# Patient Record
Sex: Female | Born: 1990 | Hispanic: Yes | Smoking: Never smoker
Health system: Southern US, Community
[De-identification: ages and names within clinical notes are randomized; demographics above are authoritative.]

## PROBLEM LIST (undated history)

## (undated) ENCOUNTER — Inpatient Hospital Stay (HOSPITAL_COMMUNITY): Payer: Self-pay

## (undated) DIAGNOSIS — O24419 Gestational diabetes mellitus in pregnancy, unspecified control: Secondary | ICD-10-CM

## (undated) DIAGNOSIS — E119 Type 2 diabetes mellitus without complications: Secondary | ICD-10-CM

## (undated) HISTORY — PX: NO PAST SURGERIES: SHX2092

---

## 2014-07-24 ENCOUNTER — Emergency Department (HOSPITAL_COMMUNITY)
Admission: EM | Admit: 2014-07-24 | Discharge: 2014-07-24 | Disposition: A | Discharge status: Home / Self Care | Payer: No Typology Code available for payment source | Source: Home / Self Care | Attending: Emergency Medicine | Admitting: Emergency Medicine

## 2014-07-24 ENCOUNTER — Other Ambulatory Visit (HOSPITAL_COMMUNITY)
Admission: RE | Admit: 2014-07-24 | Discharge: 2014-07-24 | Disposition: A | Discharge status: Home / Self Care | Payer: No Typology Code available for payment source | Source: Ambulatory Visit | Attending: Emergency Medicine | Admitting: Emergency Medicine

## 2014-07-24 ENCOUNTER — Encounter (HOSPITAL_COMMUNITY): Payer: Self-pay | Admitting: Emergency Medicine

## 2014-07-24 DIAGNOSIS — Z113 Encounter for screening for infections with a predominantly sexual mode of transmission: Secondary | ICD-10-CM | POA: Insufficient documentation

## 2014-07-24 DIAGNOSIS — N898 Other specified noninflammatory disorders of vagina: Secondary | ICD-10-CM

## 2014-07-24 DIAGNOSIS — N760 Acute vaginitis: Secondary | ICD-10-CM | POA: Insufficient documentation

## 2014-07-24 LAB — POCT URINALYSIS DIP (DEVICE)
BILIRUBIN URINE: NEGATIVE
Glucose, UA: NEGATIVE mg/dL
Hgb urine dipstick: NEGATIVE
Ketones, ur: NEGATIVE mg/dL
NITRITE: NEGATIVE
Protein, ur: NEGATIVE mg/dL
SPECIFIC GRAVITY, URINE: 1.01 (ref 1.005–1.030)
UROBILINOGEN UA: 0.2 mg/dL (ref 0.0–1.0)
pH: 7 (ref 5.0–8.0)

## 2014-07-24 LAB — POCT PREGNANCY, URINE: Preg Test, Ur: NEGATIVE

## 2014-07-24 NOTE — ED Notes (Signed)
Pt  Has  A  Vaginal  Discharge   X  1  Year              Pt  Reports         itcjhing    At  Time    Low  abd  pain

## 2014-07-24 NOTE — ED Provider Notes (Signed)
CSN: [id number]     Arrival date & time 07/24/14  [id number] History   First MD Initiated Contact with Patient 07/24/14 1004     Chief Complaint  Patient presents with  . Vaginal Discharge   (Consider location/radiation/quality/duration/timing/severity/associated sxs/prior Treatment) HPI She is a 23 year old woman here today for evaluation of vaginal discharge. An interpreter was present for the entire visit. She states she has had intermittent clear to yellow vaginal discharge for the last year. There is no associated foul odor.  It is associated with a constant burning pain under the pubic bone. She also reports some intermittent vaginal itching. Denies any vaginal pain. Denies any urinary urgency or frequency. No dysuria or hematuria. No fevers or chills. She is not currently sexually active. She reports one lifetime partner, last sex 4 months ago. Her last period was in June.  History reviewed. No pertinent past medical history. History reviewed. No pertinent past surgical history. No family history on file. History  Substance Use Topics  . Smoking status: Never Smoker   . Smokeless tobacco: Not on file  . Alcohol Use: No   OB History   Grav Para Term Preterm Abortions TAB SAB Ect Mult Living                 Review of Systems  Constitutional: Negative.   Genitourinary: Positive for vaginal discharge and pelvic pain (burning sensation). Negative for dysuria, urgency, frequency, hematuria and vaginal pain.    Allergies  Review of patient's allergies indicates no known allergies.  Home Medications   Prior to Admission medications   Not on File   BP 109/72  Pulse 70  Temp(Src) 97.9 F (36.6 C) (Oral)  Resp 16  LMP 05/12/2014 Physical Exam  Constitutional: She appears well-developed and well-nourished. No distress.  Cardiovascular: Normal rate.   Pulmonary/Chest: Effort normal.  Genitourinary: Vagina normal and uterus normal. There is no rash or lesion on the right labia.  There is no rash or lesion on the left labia. Cervix exhibits no motion tenderness, no discharge and no friability. Right adnexum displays no mass and no tenderness. Left adnexum displays no mass and no tenderness.    ED Course  Procedures (including critical care time) Labs Review Labs Reviewed  POCT URINALYSIS DIP (DEVICE) - Abnormal; Notable for the following:    Leukocytes, UA TRACE (*)    All other components within normal limits  URINE CULTURE  POCT PREGNANCY, URINE  CERVICOVAGINAL ANCILLARY ONLY    Imaging Review No results found.   MDM   1. Vaginal discharge    Urine pregnancy is negative. Urinalysis with trace leukocytes only. Will send urine for culture. No obvious discharge on exam. GC, Chlamydia, wet prep collected. Will treat based on results. Discussed that this may be normal discharge for her. Recommended the use of OTC Vagisil to help with the burning sensation. Followup as needed.    Charm RingsErin J Honig, MD 07/24/14 1044

## 2014-07-24 NOTE — Discharge Instructions (Signed)
We will call you with the results of your tests no later than Monday. In the meantime, you can use Vagisil cream from the drug store to help with the burning.  Follow up as needed.  Le llamaremos con los resultados de sus pruebas ms tardar el lunes. Mientras tanto, puede utilizar la crema Vagisil en la farmacia para ayudar con la quema.  Dar seguimiento segn sea necesario.

## 2014-07-25 LAB — URINE CULTURE: Colony Count: 30000

## 2014-07-29 MED ORDER — AZITHROMYCIN 250 MG PO TABS: 1000.0000 mg | ORAL_TABLET | Freq: Once | ORAL | Status: DC

## 2014-07-29 NOTE — ED Notes (Signed)
Dr Piedad ClimesHonig reviewed lab reports , positive for chlamydia . Called patient, and discussion w patient in Spanish to discuss positive findings, and [facility] [address] [facility]. Patient sexually active w 1 partner , who has reportedly tested negative. Was advised to contact him for repeat testing, and no sex for 10 days

## 2014-07-29 NOTE — ED Notes (Signed)
Chart review.

## 2015-07-11 ENCOUNTER — Ambulatory Visit (INDEPENDENT_AMBULATORY_CARE_PROVIDER_SITE_OTHER): Payer: Self-pay | Admitting: Family Medicine

## 2015-07-11 VITALS — BP 115/71 | HR 75 | Temp 98.4°F | Resp 16 | Ht 62.0 in | Wt 122.0 lb

## 2015-07-11 DIAGNOSIS — R350 Frequency of micturition: Secondary | ICD-10-CM

## 2015-07-11 DIAGNOSIS — O2341 Unspecified infection of urinary tract in pregnancy, first trimester: Secondary | ICD-10-CM

## 2015-07-11 DIAGNOSIS — R300 Dysuria: Secondary | ICD-10-CM

## 2015-07-11 DIAGNOSIS — N926 Irregular menstruation, unspecified: Secondary | ICD-10-CM

## 2015-07-11 DIAGNOSIS — N898 Other specified noninflammatory disorders of vagina: Secondary | ICD-10-CM

## 2015-07-11 LAB — POCT UA - MICROSCOPIC ONLY
CASTS, UR, LPF, POC: NEGATIVE
CRYSTALS, UR, HPF, POC: NEGATIVE
RBC, urine, microscopic: NEGATIVE
Yeast, UA: NEGATIVE

## 2015-07-11 LAB — POCT WET PREP WITH KOH
KOH Prep POC: NEGATIVE
TRICHOMONAS UA: NEGATIVE
YEAST WET PREP PER HPF POC: NEGATIVE

## 2015-07-11 LAB — POCT URINALYSIS DIPSTICK
BILIRUBIN UA: NEGATIVE
Glucose, UA: NEGATIVE
KETONES UA: NEGATIVE
Leukocytes, UA: NEGATIVE
NITRITE UA: NEGATIVE
PH UA: 5.5
Protein, UA: NEGATIVE
RBC UA: NEGATIVE
Spec Grav, UA: 1.02
Urobilinogen, UA: 0.2

## 2015-07-11 LAB — POCT URINE PREGNANCY: PREG TEST UR: POSITIVE — AB

## 2015-07-11 MED ORDER — COMPLETE PRENATAL/DHA 30-0.975 & 300 MG PO MISC: 1.0000 | Freq: Every day | ORAL | Status: DC

## 2015-07-11 MED ORDER — NITROFURANTOIN MONOHYD MACRO 100 MG PO CAPS: 100.0000 mg | ORAL_CAPSULE | Freq: Two times a day (BID) | ORAL | Status: DC

## 2015-07-11 NOTE — Progress Notes (Addendum)
Subjective:  This chart was scribed for Michele Staggers, MD by [facility], medical scribe at [facility].The patient was seen in exam room 12 and the patient's care was started at 10:38 AM.   Patient ID: Michele Stephens, female    DOB: [birth year], 24 y.o.   MRN: [id number] Chief Complaint  Patient presents with  . Pelvic Pain    4 days    HPI HPI Comments: Michele Stephens is a 24 y.o. female who presents to [facility] complaining of vaginal itching with occasional discharge for five days but denies vaginal pain.  She also complains of increased urinary frequency with dysuria for the past five days She has never been pregnant. LNMP was June 1st she has had irregular menstrual periods in the past. She does not take any contraceptives.She denies abdominal pain, and pelvic pain. Pt is a spanish speaker but no translator was needed.   There are no active problems to display for this patient.  History reviewed. No pertinent past medical history. History reviewed. No pertinent past surgical history. No Known Allergies Prior to Admission medications   Medication Sig Start Date End Date Taking? Authorizing Provider  azithromycin (ZITHROMAX Z-PAK) 250 MG tablet Take 4 tablets (1,000 mg total) by mouth once. 07/29/14   Charm Rings, MD   History   Social History  . Marital Status: Single    Spouse Name: N/A  . Number of Children: N/A  . Years of Education: N/A   Occupational History  . Not on file.   Social History Main Topics  . Smoking status: Never Smoker   . Smokeless tobacco: Not on file  . Alcohol Use: No  . Drug Use: Not on file  . Sexual Activity: Not on file   Other Topics Concern  . Not on file   Social History Narrative   Review of Systems  Constitutional: Negative for fever and chills.  Gastrointestinal: Negative for nausea, vomiting and abdominal pain.  Genitourinary: Positive for dysuria, frequency, vaginal discharge  and difficulty urinating. Negative for decreased urine volume, vaginal bleeding, vaginal pain and pelvic pain.      Objective:  BP 115/71 mmHg  Pulse 75  Temp(Src) 98.4 F (36.9 C) (Oral)  Resp 16  Ht  (1.575 m)  Wt 122 lb (55.339 kg)  BMI 22.31 kg/m2  SpO2 99%  LMP 06/11/2015 Physical Exam  Constitutional: She is oriented to person, place, and time. She appears well-developed and well-nourished. No distress.  HENT:  Head: Normocephalic and atraumatic.  Eyes: Pupils are equal, round, and reactive to light.  Neck: Normal range of motion.  Cardiovascular: Normal rate, regular rhythm and normal heart sounds.  Exam reveals no gallop and no friction rub.   No murmur heard. Pulmonary/Chest: Effort normal. No respiratory distress.  Abdominal: Soft. She exhibits no distension. There is no tenderness.  Genitourinary: Uterus normal. Pelvic exam was performed with patient supine. There is no rash, tenderness, lesion or injury on the right labia. There is no rash, tenderness, lesion or injury on the left labia. Uterus is not enlarged and not tender. Cervix exhibits no motion tenderness, no discharge and no friability. Right adnexum displays no tenderness. Left adnexum displays no tenderness. No tenderness or bleeding in the vagina. Vaginal discharge (min white discharge in vault. ) found.  Cervix closed, nulliparous.   Musculoskeletal: Normal range of motion.  Neurological: She is alert and oriented to person, place, and time.  Skin: Skin  is warm and dry.  Psychiatric: She has a normal mood and affect. Her behavior is normal.  Nursing note and vitals reviewed.     Results for orders placed or performed in visit on 07/11/15  POCT urinalysis dipstick  Result Value Ref Range   Color, UA Yellow    Clarity, UA Clear    Glucose, UA Negative    Bilirubin, UA Negative    Ketones, UA Negative    Spec Grav, UA 1.020    Blood, UA Negative    pH, UA 5.5    Protein, UA Negative     Urobilinogen, UA 0.2    Nitrite, UA Negative    Leukocytes, UA Negative Negative  POCT UA - Microscopic Only  Result Value Ref Range   WBC, Ur, HPF, POC 5-10    RBC, urine, microscopic Negative    Bacteria, U Microscopic Trace    Mucus, UA Trace    Epithelial cells, urine per micros 5-8    Crystals, Ur, HPF, POC Neg    Casts, Ur, LPF, POC Neg    Yeast, UA Neg   POCT urine pregnancy  Result Value Ref Range   Preg Test, Ur Positive (A) Negative  POCT Wet Prep with KOH  Result Value Ref Range   Trichomonas, UA Negative    Clue Cells Wet Prep HPF POC 0-2    Epithelial Wet Prep HPF POC Moderate Few, Moderate, Many   Yeast Wet Prep HPF POC neg    Bacteria Wet Prep HPF POC Moderate (A) None, Few   RBC Wet Prep HPF POC 0-2    WBC Wet Prep HPF POC 5-10    KOH Prep POC Negative      Assessment & Plan:   Michele Stephens is a 24 y.o. female Irregular menstrual cycle - Plan: POCT urine pregnancy, Prenatal-FE Bis-FA-DHA w/o A (COMPLETE PRENATAL/DHA) 30-0.975 & 300 MG MISC  Urinary frequency - Plan: POCT urinalysis dipstick, POCT UA - Microscopic Only, POCT Wet Prep with KOH  Dysuria - Plan: POCT urinalysis dipstick, POCT UA - Microscopic Only  Vaginal discharge - Plan: POCT Wet Prep with KOH  Urinary tract infection during pregnancy, first trimester - Plan: Urine culture, nitrofurantoin, macrocrystal-monohydrate, (MACROBID) 100 MG capsule   G1P0 at 8 weeks, 3 days by LMP. New diagnosis of pregnancy, unplanned but currently would like to continue pregnancy. On further discussion, she denies any abdominal or pelvic pain as opposed to chief complaint. Primary concern was dysuria, urinary frequency, and possibly new vaginal discharge.  -Possible early UTI. Based on symptoms and pregnancy, will go ahead and start treatment with Macrobid 100 mg twice a day. Check urine culture, then if negative can stop Macrobid.   -No concerns on wet prep. Low risk of STI's, so this testing was deferred  today, but can be completed at her obstetrical visit. She was given number for women's health to set up appointment, but can call if other numbers needed for obstetrician in town.  - Start prenatal vitamins daily. Handout given on first trimester pregnancy common concerns. Return to clinic/ER/MAU at [facility] precautions discussed.  Language barrier -  Spanish spoken and interpreting service used on phone at one point for further clarification. Understanding expressed.  Meds ordered this encounter  Medications  . nitrofurantoin, macrocrystal-monohydrate, (MACROBID) 100 MG capsule    Sig: Take 1 capsule (100 mg total) by mouth 2 (two) times daily.    Dispense:  14 capsule    Refill:  0  Label in spanish  . Prenatal-FE Bis-FA-DHA w/o A (COMPLETE PRENATAL/DHA) 30-0.975 & 300 MG MISC    Sig: Take 1 tablet by mouth daily.    Dispense:  30 each    Refill:  6    Prenatal vitamin of choice QD. Label in spanish.   Patient Instructions  Su prueba de embarazad fue positivo.  Voy a referirle a specialista por embarazad, pero es necesario tome vitamino prenatal cada dia.   Voy trata por infeccion en orina, pero otra prueba en 3-4 dias pero chequiar infeccion. voy a llamarle.   Primer trimestre de Psychiatrist (First Trimester of Pregnancy) El primer trimestre de Psychiatrist se extiende desde la semana1 hasta el final de la semana12 (mes1 al mes3). Una semana despus de que un espermatozoide fecunda un vulo, este se implantar en la pared uterina. Este embrin comenzar a Camera operator convertirse en un beb. Sus genes y los de su pareja forman el beb. Los genes del varn determinan si ser un nio o una nia. Entre la semana6 y [place], se forman los ojos y [place], y los latidos del corazn pueden verse en la ecografa. Al final de las 12semanas, todos los rganos del beb estn formados.  Ahora que est embarazada, querr hacer todo lo que est a su alcance para tener un beb sano. Dos  de las cosas ms importantes son Winferd Humphrey buena atencin prenatal y seguir las indicaciones del mdico. La atencin prenatal incluye toda la asistencia mdica que usted recibe antes del nacimiento del beb. Esta ayudar a prevenir, Engineer, manufacturing y tratar cualquier problema durante el embarazo y [place]. CAMBIOS EN EL ORGANISMO Su organismo atraviesa por muchos cambios durante el [place], y estos varan de Neomia Dear mujer a Educational psychologist.   Al principio, puede aumentar o bajar algunos kilos.  Puede tener Programme researcher, broadcasting/film/video (nuseas) y vomitar. Si no puede controlar los vmitos, llame al mdico.  Puede cansarse con facilidad.  Es posible que tenga dolores de cabeza que pueden aliviarse con los medicamentos que el mdico le permita tomar.  Puede orinar con mayor frecuencia. El dolor al orinar puede significar que usted tiene una infeccin de la vejiga.  Debido al [place], puede tener acidez estomacal.  Puede estar estreida, ya que ciertas hormonas enlentecen los movimientos de los msculos que [employer] desechos a travs de los intestinos.  Pueden aparecer hemorroides o abultarse e hincharse las venas (venas varicosas).  Las [employer] Foods pueden empezar a Government social research officer y Emergency planning/management officer. Los pezones pueden sobresalir ms, y el tejido que los rodea (areola) tornarse ms oscuro.  Las Veterinary surgeon y estar sensibles al cepillado y al hilo dental.  Pueden aparecer zonas oscuras o manchas (cloasma, mscara del Psychiatrist) en el rostro que probablemente se atenuarn despus del nacimiento del beb.  Los perodos menstruales se interrumpirn.  Tal vez no tenga apetito.  Puede sentir un fuerte deseo de consumir ciertos alimentos.  Puede tener cambios a Theatre manager a da, por ejemplo, por momentos puede estar emocionada por el Psychiatrist y por otros preocuparse porque algo pueda salir mal con el embarazo o el beb.  Tendr sueos ms vvidos y extraos.  Tal vez haya cambios en el cabello que pueden  incluir su engrosamiento, crecimiento rpido y cambios en la textura. A algunas mujeres tambin se les cae el cabello durante o despus del [place], o tienen el cabello seco o fino. Lo ms probable es que el cabello se le normalice despus del nacimiento del beb. QU DEBE ESPERAR  EN LAS CONSULTAS PRENATALES Durante una visita prenatal de rutina:  La pesarn para asegurarse de que usted y el beb estn creciendo normalmente.  Le controlarn la presin arterial.  Le medirn el abdomen para controlar el desarrollo del beb.  Se escucharn los latidos cardacos a partir de la semana10 o la12 de embarazo, aproximadamente.  Se analizarn los resultados de los estudios solicitados en visitas anteriores. El mdico puede preguntarle:  Cmo se siente.  Si siente los movimientos del beb.  Si ha tenido sntomas anormales, como prdida de lquido, [place], dolores de cabeza intensos o clicos abdominales.  Si tiene [employer]. Otros estudios que pueden realizarse durante el primer trimestre incluyen lo siguiente:  Anlisis de sangre para determinar el tipo de sangre y Engineer, manufacturing la presencia de infecciones previas. Adems, se los usar para controlar si los niveles de hierro son bajos (anemia) y Chief Strategy Officer los anticuerpos Rh. En una etapa ms avanzada del [place], se harn anlisis de sangre para saber si tiene diabetes, junto con otros estudios si surgen problemas.  Anlisis de orina para detectar infecciones, diabetes o protenas en la orina.  Una ecografa para confirmar que el beb crece y se desarrolla correctamente.  Una amniocentesis para diagnosticar posibles problemas genticos.  Estudios del feto para descartar espina bfida y sndrome de [place].  Es posible que necesite otras pruebas adicionales. INSTRUCCIONES PARA EL CUIDADO EN EL HOGAR  Medicamentos:  Siga las indicaciones del mdico en relacin con el uso de medicamentos. Durante el embarazo, hay medicamentos que pueden  tomarse y otros que no.  Tome las vitaminas prenatales como se le indic.  Si est estreida, tome un laxante suave, si el mdico lo [place]. Dieta  Consuma alimentos balanceados. Elija alimentos variados, como carne o protenas de origen vegetal, pescado, leche y productos lcteos descremados, verduras, frutas y panes y Radiation protection practitioner. El mdico la ayudar a Production assistant, radio cantidad de peso que puede [place].  No coma carne cruda ni quesos sin cocinar. Estos elementos contienen bacterias que pueden causar defectos congnitos en el beb.  La ingesta diaria de cuatro o cinco comidas pequeas en lugar de tres comidas abundantes puede ayudar a [employer] nuseas y los vmitos. Si empieza a tener nuseas, comer algunas [address] puede ser de [place]. Beber lquidos [place] comidas en lugar de tomarlos durante las comidas tambin puede ayudar a Optician, dispensing las nuseas y los vmitos.  Si est estreida, consuma alimentos con alto contenido de [place], como verduras y frutas frescas, y Radiation protection practitioner. Beba suficiente lquido para Photographer orina clara o de color amarillo plido. Actividad y Landscape architect ejercicio solamente como se lo haya indicado el mdico. El ejercicio la ayudar a:  Art gallery manager.  Mantenerse en forma.  Estar preparada para el trabajo de parto y [place].  Los dolores, los clicos en la parte baja del abdomen o los calambres en la cintura son un buen indicio de que debe dejar de Corporate treasurer. Consulte al mdico antes de seguir haciendo ejercicios normales.  Intente no estar de pie FedEx. Mueva las piernas con frecuencia si debe estar de pie en un lugar durante mucho tiempo.  Evite levantar pesos [employer]ands.  Use zapatos de tacones bajos y [place].  Puede seguir teniendo [employer] [place] [employer], excepto que el mdico le indique lo contrario. Alivio del dolor o las molestias  Use un sostn que le brinde buen soporte  si siente dolor a la palpacin Mattel.  Dese baos de asiento con agua tibia para Engineer, materials o las molestias causadas por las hemorroides. Use crema antihemorroidal si el mdico se lo permite.  Descanse con las piernas elevadas si tiene calambres o dolor de cintura.  Si tiene venas varicosas en las piernas, use medias de descanso. Eleve los pies durante , 3 o 4veces por da. Limite la cantidad de sal en su dieta. Cuidados prenatales  Programe las visitas prenatales para la semana12 de [place]. Generalmente se programan cada mes al principio y se hacen ms frecuentes en los 2 ltimos meses antes del parto.  Escriba sus preguntas. Llvelas cuando concurra a las visitas prenatales.  Concurra a todas las visitas prenatales como se lo haya indicado el mdico. Seguridad  Colquese el cinturn de seguridad cuando conduzca.  Haga una lista de los nmeros de telfono de Associate Professor, que [employer] nmeros de telfono de familiares, [place], el hospital y los departamentos de polica y bomberos. Consejos generales  Pdale al mdico que la derive a clases de educacin prenatal en su localidad. Debe comenzar a tomar las clases antes de Cytogeneticist en el mes6 de embarazo.  Pida ayuda si tiene necesidades nutricionales o de asesoramiento Academic librarian. El mdico puede aconsejarla o derivarla a especialistas para que la ayuden con diferentes necesidades.  No se d baos de inmersin en agua caliente, baos turcos ni saunas.  No se haga duchas vaginales ni use tampones o toallas higinicas perfumadas.  No mantenga las piernas cruzadas durante [place].  Evite el contacto con las bandejas sanitarias de los gatos y la tierra que estos animales usan. Estos elementos contienen bacterias que pueden causar defectos congnitos al beb y la posible prdida del feto debido a un aborto espontneo o muerte fetal.  No fume, no consuma hierbas ni medicamentos que no hayan sido  recetados por el mdico. Las sustancias qumicas que estos productos contienen afectan la formacin y el desarrollo del beb.  Programe una cita con el dentista. En su casa, lvese los dientes con un cepillo dental blando y psese el hilo dental con suavidad. SOLICITE ATENCIN MDICA SI:   Tiene mareos.  Siente clicos leves, presin en la pelvis o dolor persistente en el abdomen.  Tiene nuseas, vmitos o diarrea persistentes.  Tiene secrecin vaginal con mal olor.  Siente dolor al [employer]oco[employer]illips.  Tiene el rostro, las [place], las piernas o los tobillos ms hinchados. SOLICITE ATENCIN MDICA DE INMEDIATO SI:   Tiene fiebre.  Tiene una prdida de lquido por la vagina.  Tiene sangrado o pequeas prdidas vaginales.  Siente dolor intenso o clicos en el abdomen.  Sube o baja de peso rpidamente.  Vomita sangre de color rojo brillante o material que parezca granos de caf.  Ha estado expuesta a la rubola y no ha sufrido la enfermedad.  Ha estado expuesta a la quinta enfermedad o a la varicela.  Tiene un dolor de cabeza intenso.  Le falta el aire.  Sufre cualquier tipo de traumatismo, por ejemplo, debido a una cada o un accidente automovilstico. Document Released: 09/07/2005 Document Revised: 04/14/2014 [facility] Patient Information 2015 [place], [place]. This information is not intended to replace advice given to you by your health care provider. Make sure you discuss any questions you have with your health care provider.   Infeccin urinaria  (Urinary Tract Infection)  La infeccin urinaria puede ocurrir en Corporate treasurer del tracto urinario. El tracto urinario es un sistema de drenaje del cuerpo por el que se [employer]  desechos y el exceso de [place]. El tracto urinario est formado por dos riones, dos urteres, la vejiga y Engineer, mining. Los riones son rganos que tienen forma de frijol. Cada rin tiene aproximadamente el tamao del puo. Estn situados debajo de las [place],  uno a cada lado de la columna vertebral CAUSAS  La causa de la infeccin son los microbios, que son organismos microscpicos, que incluyen hongos, virus, y bacterias. Estos organismos son tan pequeos que slo pueden verse a travs del microscopio. Las bacterias son los microorganismos que ms comnmente causan infecciones urinarias.  SNTOMAS  Los sntomas pueden variar segn la edad y el sexo del paciente y por la ubicacin de la infeccin. Los sntomas en las mujeres jvenes incluyen la necesidad frecuente e intensa de orinar y una sensacin dolorosa de ardor en la vejiga o en la uretra durante la miccin. Las mujeres y los hombres mayores podrn sentir cansancio, temblores y debilidad y Futures trader musculares y Engineer, mining abdominal. Si tiene [place], puede significar que la infeccin est en los riones. Otros sntomas son dolor en la espalda o en los lados debajo de las [place], nuseas y vmitos.  DIAGNSTICO  Para diagnosticar una infeccin urinaria, el mdico le preguntar acerca de sus sntomas. Gen[employer] Parts una [place]. La muestra de orina se analiza para Engineer, manufacturing bacterias y glbulos blancos de Risk manager. Los glbulos blancos se forman en el organismo para ayudar a Artist las infecciones.  TRATAMIENTO  Por lo general, las infecciones urinarias pueden tratarse con medicamentos. Debido a que la Harley[employer]son de las infecciones son causadas por bacterias, por lo general pueden tratarse con antibiticos. La eleccin del antibitico y la duracin del tratamiento depender de sus sntomas y el tipo de bacteria causante de la infeccin.  INSTRUCCIONES PARA EL CUIDADO EN EL HOGAR   Si le recetaron antibiticos, tmelos exactamente como su mdico le indique. Termine el medicamento aunque se sienta mejor despus de haber tomado slo algunos.  Beba gran cantidad de lquido para mantener la orina de tono claro o color amarillo plido.  Evite la cafena, el t y las [address] Estas  sustancias irritan la vejiga.  Vaciar la vejiga con frecuencia. Evite retener la orina durante largos perodos.  Vace la vejiga antes y despus de Management consultant.  Despus de mover el intestino, las mujeres deben higienizarse la regin perineal desde adelante hacia atrs. Use slo un papel tissue por vez. SOLICITE ATENCIN MDICA SI:   Siente dolor en la espalda.  Le sube la fiebre.  Los sntomas no mejoran luego de [address]. SOLICITE ATENCIN MDICA DE INMEDIATO SI:   Siente dolor intenso en la espalda o en la zona inferior del abdomen.  Comienza a sentir escalofros.  Tiene nuseas o vmitos.  Tiene una sensacin continua de quemazn o molestias al [employer]oco[employer]ill[employer]. ASEGRESE DE QUE:   Comprende estas instrucciones.  Controlar su enfermedad.  Solicitar ayuda de inmediato si no mejora o empeora. Document Released: 09/07/2005 Document Revised: 08/22/2012 [facility] Patient Information 2015 [place], [place]. This information is not intended to replace advice given to you by your health care provider. Make sure you discuss any questions you have with your health care provider.

## 2015-07-11 NOTE — Patient Instructions (Addendum)
Su prueba de embarazad fue positivo.   es necesario tome vitamino prenatal cada dia. llame clinical por cita de specialista de embarazo: [phone].   Voy trata por infeccion en orina, pero otra prueba en 3-4 dias pero chequiar infeccion. voy a llamarle.   Primer trimestre de Psychiatrist (First Trimester of Pregnancy) El primer trimestre de Psychiatrist se extiende desde la semana1 hasta el final de la semana12 (mes1 al mes3). Una semana despus de que un espermatozoide fecunda un vulo, este se implantar en la pared uterina. Este embrin comenzar a Camera operator convertirse en un beb. Sus genes y los de su pareja forman el beb. Los genes del varn determinan si ser un nio o una nia. Entre la semana6 y [place], se forman los ojos y [place], y los latidos del corazn pueden verse en la ecografa. Al final de las 12semanas, todos los rganos del beb estn formados.  Ahora que est embarazada, querr hacer todo lo que est a su alcance para tener un beb sano. Dos de las cosas ms importantes son Winferd Humphrey buena atencin prenatal y seguir las indicaciones del mdico. La atencin prenatal incluye toda la asistencia mdica que usted recibe antes del nacimiento del beb. Esta ayudar a prevenir, Engineer, manufacturing y tratar cualquier problema durante el embarazo y [place]. CAMBIOS EN EL ORGANISMO Su organismo atraviesa por muchos cambios durante el [place], y estos varan de Neomia Dear mujer a Educational psychologist.   Al principio, puede aumentar o bajar algunos kilos.  Puede tener Programme researcher, broadcasting/film/video (nuseas) y vomitar. Si no puede controlar los vmitos, llame al mdico.  Puede cansarse con facilidad.  Es posible que tenga dolores de cabeza que pueden aliviarse con los medicamentos que el mdico le permita tomar.  Puede orinar con mayor frecuencia. El dolor al orinar puede significar que usted tiene una infeccin de la vejiga.  Debido al [place], puede tener acidez estomacal.  Puede estar estreida, ya que ciertas  hormonas enlentecen los movimientos de los msculos que [employer] desechos a travs de los intestinos.  Pueden aparecer hemorroides o abultarse e hincharse las venas (venas varicosas).  Las [employer] pueden empezar a Government social research officer y Emergency planning/management officer. Los pezones pueden sobresalir ms, y el tejido que los rodea (areola) tornarse ms oscuro.  Las Veterinary surgeon y estar sensibles al cepillado y al hilo dental.  Pueden aparecer zonas oscuras o manchas (cloasma, mscara del Psychiatrist) en el rostro que probablemente se atenuarn despus del nacimiento del beb.  Los perodos menstruales se interrumpirn.  Tal vez no tenga apetito.  Puede sentir un fuerte deseo de consumir ciertos alimentos.  Puede tener cambios a Theatre manager a da, por ejemplo, por momentos puede estar emocionada por el Psychiatrist y por otros preocuparse porque algo pueda salir mal con el embarazo o el beb.  Tendr sueos ms vvidos y extraos.  Tal vez haya cambios en el cabello que pueden incluir su engrosamiento, crecimiento rpido y cambios en la textura. A algunas mujeres tambin se les cae el cabello durante o despus del [place], o tienen el cabello seco o fino. Lo ms probable es que el cabello se le normalice despus del nacimiento del beb. QU DEBE ESPERAR EN LAS CONSULTAS PRENATALES Durante una visita prenatal de rutina:  La pesarn para asegurarse de que usted y el beb estn creciendo normalmente.  Le controlarn la presin arterial.  Le medirn el abdomen para controlar el desarrollo del beb.  Se escucharn los latidos cardacos a partir de la semana10 o AV40  de embarazo, aproximadamente.  Se analizarn los resultados de los estudios solicitados en visitas anteriores. El mdico puede preguntarle:  Cmo se siente.  Si siente los movimientos del beb.  Si ha tenido sntomas anormales, como prdida de lquido, [place], dolores de cabeza intensos o clicos abdominales.  Si tiene [employer]. Otros estudios que pueden realizarse durante el primer trimestre incluyen lo siguiente:  Anlisis de sangre para determinar el tipo de sangre y Engineer, manufacturing la presencia de infecciones previas. Adems, se los usar para controlar si los niveles de hierro son bajos (anemia) y Chief Strategy Officer los anticuerpos Rh. En una etapa ms avanzada del [place], se harn anlisis de sangre para saber si tiene diabetes, junto con otros estudios si surgen problemas.  Anlisis de orina para detectar infecciones, diabetes o protenas en la orina.  Una ecografa para confirmar que el beb crece y se desarrolla correctamente.  Una amniocentesis para diagnosticar posibles problemas genticos.  Estudios del feto para descartar espina bfida y sndrome de [place].  Es posible que necesite otras pruebas adicionales. INSTRUCCIONES PARA EL CUIDADO EN EL HOGAR  Medicamentos:  Siga las indicaciones del mdico en relacin con el uso de medicamentos. Durante el embarazo, hay medicamentos que pueden tomarse y otros que no.  Tome las vitaminas prenatales como se le indic.  Si est estreida, tome un laxante suave, si el mdico lo [place]. Dieta  Consuma alimentos balanceados. Elija alimentos variados, como carne o protenas de origen vegetal, pescado, leche y productos lcteos descremados, verduras, frutas y panes y Radiation protection practitioner. El mdico la ayudar a Production assistant, radio cantidad de peso que puede [place].  No coma carne cruda ni quesos sin cocinar. Estos elementos contienen bacterias que pueden causar defectos congnitos en el beb.  La ingesta diaria de cuatro o cinco comidas pequeas en lugar de tres comidas abundantes puede ayudar a [employer] nuseas y los vmitos. Si empieza a tener nuseas, comer algunas [address] puede ser de [place]. Beber lquidos [place] comidas en lugar de tomarlos durante las comidas tambin puede ayudar a Optician, dispensing las nuseas y los vmitos.  Si est estreida, consuma alimentos  con alto contenido de [place], como verduras y frutas frescas, y Radiation protection practitioner. Beba suficiente lquido para Photographer orina clara o de color amarillo plido. Actividad y Landscape architect ejercicio solamente como se lo haya indicado el mdico. El ejercicio la ayudar a:  Art gallery manager.  Mantenerse en forma.  Estar preparada para el trabajo de parto y [place].  Los dolores, los clicos en la parte baja del abdomen o los calambres en la cintura son un buen indicio de que debe dejar de Corporate treasurer. Consulte al mdico antes de seguir haciendo ejercicios normales.  Intente no estar de pie [employer]Ex. Mueva las piernas con frecuencia si debe estar de pie en un lugar durante mucho tiempo.  Evite levantar pesos [employer].  Use zapatos de tacones bajos y [place].  Puede seguir teniendo [employer] [place] [employer], excepto que el mdico le indique lo contrario. Alivio del dolor o las molestias  Use un sostn que le brinde buen soporte si siente dolor a la palpacin [employer]el.  Dese baos de asiento con agua tibia para Engineer, materials o las molestias causadas por las hemorroides. Use crema antihemorroidal si el mdico se lo permite.  Descanse con las piernas elevadas si tiene calambres o dolor de cintura.  Si tiene venas varicosas en las piernas, use medias de descanso. Eleve los  pies durante , 3 o 4veces por da. Limite la cantidad de sal en su dieta. Cuidados prenatales  Programe las visitas prenatales para la semana12 de [place]. Generalmente se programan cada mes al principio y se hacen ms frecuentes en los 2 ltimos meses antes del parto.  Escriba sus preguntas. Llvelas cuando concurra a las visitas prenatales.  Concurra a todas las visitas prenatales como se lo haya indicado el mdico. Seguridad  Colquese el cinturn de seguridad cuando conduzca.  Haga una lista de los nmeros de telfono de Associate Professor, que [employer]  nmeros de telfono de familiares, [place], el hospital y los departamentos de polica y bomberos. Consejos generales  Pdale al mdico que la derive a clases de educacin prenatal en su localidad. Debe comenzar a tomar las clases antes de Cytogeneticist en el mes6 de embarazo.  Pida ayuda si tiene necesidades nutricionales o de asesoramiento Academic librarian. El mdico puede aconsejarla o derivarla a especialistas para que la ayuden con diferentes necesidades.  No se d baos de inmersin en agua caliente, baos turcos ni saunas.  No se haga duchas vaginales ni use tampones o toallas higinicas perfumadas.  No mantenga las piernas cruzadas durante [place].  Evite el contacto con las bandejas sanitarias de los gatos y la tierra que estos animales usan. Estos elementos contienen bacterias que pueden causar defectos congnitos al beb y la posible prdida del feto debido a un aborto espontneo o muerte fetal.  No fume, no consuma hierbas ni medicamentos que no hayan sido recetados por el mdico. Las sustancias qumicas que estos productos contienen afectan la formacin y el desarrollo del beb.  Programe una cita con el dentista. En su casa, lvese los dientes con un cepillo dental blando y psese el hilo dental con suavidad. SOLICITE ATENCIN MDICA SI:   Tiene mareos.  Siente clicos leves, presin en la pelvis o dolor persistente en el abdomen.  Tiene nuseas, vmitos o diarrea persistentes.  Tiene secrecin vaginal con mal olor.  Siente dolor al [employer].  Tiene el rostro, las [place], las piernas o los tobillos ms hinchados. SOLICITE ATENCIN MDICA DE INMEDIATO SI:   Tiene fiebre.  Tiene una prdida de lquido por la vagina.  Tiene sangrado o pequeas prdidas vaginales.  Siente dolor intenso o clicos en el abdomen.  Sube o baja de peso rpidamente.  Vomita sangre de color rojo brillante o material que parezca granos de caf.  Ha estado expuesta a la rubola y no ha  sufrido la enfermedad.  Ha estado expuesta a la quinta enfermedad o a la varicela.  Tiene un dolor de cabeza intenso.  Le falta el aire.  Sufre cualquier tipo de traumatismo, por ejemplo, debido a una cada o un accidente automovilstico. Document Released: 09/07/2005 Document Revised: 04/14/2014 [facility] Patient Information 2015 [place], [place]. This information is not intended to replace advice given to you by your health care provider. Make sure you discuss any questions you have with your health care provider.   Infeccin urinaria  (Urinary Tract Infection)  La infeccin urinaria puede ocurrir en Corporate treasurer del tracto urinario. El tracto urinario es un sistema de drenaje del cuerpo por el que se eliminan los desechos y el exceso de Reader. El tracto urinario est formado por dos riones, dos urteres, la vejiga y Engineer, mining. Los riones son rganos que tienen forma de frijol. Cada rin tiene aproximadamente el tamao del puo. Estn situados debajo de las [place], uno a cada lado de la columna vertebral CAUSAS  La causa de la infeccin son los microbios, que son organismos microscpicos, que incluyen hongos, virus, y bacterias. Estos organismos son tan pequeos que slo pueden verse a travs del microscopio. Las bacterias son los microorganismos que ms comnmente causan infecciones urinarias.  SNTOMAS  Los sntomas pueden variar segn la edad y el sexo del paciente y por la ubicacin de la infeccin. Los sntomas en las mujeres jvenes incluyen la necesidad frecuente e intensa de orinar y una sensacin dolorosa de ardor en la vejiga o en la uretra durante la miccin. Las mujeres y los hombres mayores podrn sentir cansancio, temblores y debilidad y Futures trader musculares y Engineer, mining abdominal. Si tiene [place], puede significar que la infeccin est en los riones. Otros sntomas son dolor en la espalda o en los lados debajo de las [place], nuseas y vmitos.  DIAGNSTICO  Para  diagnosticar una infeccin urinaria, el mdico le preguntar acerca de sus sntomas. [employer] una [place] de [place]. La muestra de orina se analiza para Engineer, manufacturing bacterias y glbulos blancos de Risk manager. Los glbulos blancos se forman en el organismo para ayudar a Artist las infecciones.  TRATAMIENTO  Por lo general, las infecciones urinarias pueden tratarse con medicamentos. Debido a que la [employer] de las infecciones son causadas por bacterias, por lo general pueden tratarse con antibiticos. La eleccin del antibitico y la duracin del tratamiento depender de sus sntomas y el tipo de bacteria causante de la infeccin.  INSTRUCCIONES PARA EL CUIDADO EN EL HOGAR   Si le recetaron antibiticos, tmelos exactamente como su mdico le indique. Termine el medicamento aunque se sienta mejor despus de haber tomado slo algunos.  Beba gran cantidad de lquido para mantener la orina de tono claro o color amarillo plido.  Evite la cafena, el t y las [address]. Estas sustancias irritan la vejiga.  Vaciar la vejiga con frecuencia. Evite retener la orina durante largos perodos.  Vace la vejiga antes y despus de Management consultant.  Despus de mover el intestino, las mujeres deben higienizarse la regin perineal desde adelante hacia atrs. Use slo un papel tissue por vez. SOLICITE ATENCIN MDICA SI:   Siente dolor en la espalda.  Le sube la fiebre.  Los sntomas no mejoran luego de [address]. SOLICITE ATENCIN MDICA DE INMEDIATO SI:   Siente dolor intenso en la espalda o en la zona inferior del abdomen.  Comienza a sentir escalofros.  Tiene nuseas o vmitos.  Tiene una sensacin continua de quemazn o molestias al [employer]. ASEGRESE DE QUE:   Comprende estas instrucciones.  Controlar su enfermedad.  Solicitar ayuda de inmediato si no mejora o empeora. Document Released: 09/07/2005 Document Revised: 08/22/2012 [facility] Patient Information 2015  [place], [place]. This information is not intended to replace advice given to you by your health care provider. Make sure you discuss any questions you have with your health care provider.

## 2015-07-13 LAB — URINE CULTURE: Colony Count: 85000

## 2015-07-18 ENCOUNTER — Telehealth: Payer: Self-pay | Admitting: Family Medicine

## 2015-07-18 ENCOUNTER — Encounter: Payer: Self-pay | Admitting: Family Medicine

## 2015-07-18 NOTE — Telephone Encounter (Signed)
Patient called i gave her results and i let her know i put letter in mail

## 2015-08-27 ENCOUNTER — Other Ambulatory Visit (HOSPITAL_COMMUNITY): Payer: Self-pay | Admitting: Physician Assistant

## 2015-08-27 ENCOUNTER — Encounter (HOSPITAL_COMMUNITY): Payer: Self-pay

## 2015-08-27 DIAGNOSIS — Z3689 Encounter for other specified antenatal screening: Secondary | ICD-10-CM

## 2015-08-27 DIAGNOSIS — Z3A18 18 weeks gestation of pregnancy: Secondary | ICD-10-CM

## 2015-08-27 LAB — OB RESULTS CONSOLE HEPATITIS B SURFACE ANTIGEN: Hepatitis B Surface Ag: NEGATIVE

## 2015-08-27 LAB — OB RESULTS CONSOLE GC/CHLAMYDIA
Chlamydia: NEGATIVE
Gonorrhea: NEGATIVE

## 2015-08-27 LAB — OB RESULTS CONSOLE RUBELLA ANTIBODY, IGM: Rubella: IMMUNE

## 2015-08-27 LAB — OB RESULTS CONSOLE HGB/HCT, BLOOD
HCT: 39 %
Hemoglobin: 13 g/dL

## 2015-08-27 LAB — OB RESULTS CONSOLE HIV ANTIBODY (ROUTINE TESTING): HIV: NONREACTIVE

## 2015-08-27 LAB — OB RESULTS CONSOLE PLATELET COUNT: PLATELETS: 259 10*3/uL

## 2015-08-27 LAB — OB RESULTS CONSOLE VARICELLA ZOSTER ANTIBODY, IGG: Varicella: IMMUNE

## 2015-08-27 LAB — OB RESULTS CONSOLE RPR: RPR: NONREACTIVE

## 2015-09-24 ENCOUNTER — Encounter (HOSPITAL_COMMUNITY): Payer: Self-pay

## 2015-09-24 ENCOUNTER — Other Ambulatory Visit (HOSPITAL_COMMUNITY): Payer: Self-pay | Admitting: Physician Assistant

## 2015-09-24 ENCOUNTER — Ambulatory Visit (HOSPITAL_COMMUNITY)
Admission: RE | Admit: 2015-09-24 | Discharge: 2015-09-24 | Disposition: A | Discharge status: Home / Self Care | Payer: Medicaid Other | Source: Ambulatory Visit | Attending: Physician Assistant | Admitting: Physician Assistant

## 2015-09-24 DIAGNOSIS — Z3A18 18 weeks gestation of pregnancy: Secondary | ICD-10-CM | POA: Insufficient documentation

## 2015-09-24 DIAGNOSIS — Z3689 Encounter for other specified antenatal screening: Secondary | ICD-10-CM

## 2015-09-24 DIAGNOSIS — Z36 Encounter for antenatal screening of mother: Secondary | ICD-10-CM | POA: Insufficient documentation

## 2015-12-13 NOTE — L&D Delivery Note (Signed)
Delivery Note  Patient presented with PROM 4/2 @ 09:00. Total time of rupture ~ 30 hours. Patient developed triple I and was treated with amp/gent. Recurrent late decels treated with IV fluids, O2, fluid bolus, and terbutaline x1. Pushed for ~3 hours.  At 3:34 PM a viable female was delivered via Vaginal, Spontaneous Delivery (Presentation: Left Occiput Anterior).  APGAR: 7, 9; weight 7 lb 9.2 oz (3435 g).   Placenta status: Intact, Spontaneous.  Cord: 3 vessels with the following complications: None.  Cord pH: attempted but unable to draw.  Anesthesia: Epidural Local  Episiotomy: None Lacerations: left vaginal side-wall Suture Repair: 3.0 vicryl Est. Blood Loss (mL): 205  Mom to postpartum.  Baby to Couplet care / Skin to Skin.  Michele Stephens 03/14/2016, 4:45 PM

## 2015-12-24 LAB — OB RESULTS CONSOLE RPR: RPR: NONREACTIVE

## 2015-12-24 LAB — OB RESULTS CONSOLE HGB/HCT, BLOOD
HEMATOCRIT: 35 %
Hemoglobin: 11.5 g/dL

## 2015-12-24 LAB — OB RESULTS CONSOLE HIV ANTIBODY (ROUTINE TESTING): HIV: NONREACTIVE

## 2016-02-18 LAB — OB RESULTS CONSOLE GBS: STREP GROUP B AG: NEGATIVE

## 2016-02-18 LAB — OB RESULTS CONSOLE GC/CHLAMYDIA
CHLAMYDIA, DNA PROBE: NEGATIVE
Gonorrhea: NEGATIVE

## 2016-03-13 ENCOUNTER — Inpatient Hospital Stay (HOSPITAL_COMMUNITY)
Admission: AD | Admit: 2016-03-13 | Discharge: 2016-03-16 | DRG: 774 | Disposition: A | Discharge status: Home / Self Care | Payer: Medicaid Other | Source: Ambulatory Visit | Attending: Family Medicine | Admitting: Family Medicine

## 2016-03-13 ENCOUNTER — Encounter (HOSPITAL_COMMUNITY): Payer: Self-pay | Admitting: Certified Nurse Midwife

## 2016-03-13 DIAGNOSIS — O4292 Full-term premature rupture of membranes, unspecified as to length of time between rupture and onset of labor: Secondary | ICD-10-CM | POA: Diagnosis present

## 2016-03-13 DIAGNOSIS — O480 Post-term pregnancy: Secondary | ICD-10-CM | POA: Diagnosis present

## 2016-03-13 DIAGNOSIS — Z3A40 40 weeks gestation of pregnancy: Secondary | ICD-10-CM | POA: Diagnosis not present

## 2016-03-13 DIAGNOSIS — O411230 Chorioamnionitis, third trimester, not applicable or unspecified: Secondary | ICD-10-CM | POA: Diagnosis present

## 2016-03-13 HISTORY — DX: Type 2 diabetes mellitus without complications: E11.9

## 2016-03-13 LAB — URINALYSIS, ROUTINE W REFLEX MICROSCOPIC
Bilirubin Urine: NEGATIVE
Glucose, UA: NEGATIVE mg/dL
Ketones, ur: NEGATIVE mg/dL
Nitrite: NEGATIVE
Protein, ur: NEGATIVE mg/dL
Specific Gravity, Urine: <1.005 — ABNORMAL LOW (ref 1.005–1.030)
pH: 6 (ref 5.0–8.0)

## 2016-03-13 LAB — CBC
HEMATOCRIT: 36.4 % (ref 36.0–46.0)
HEMOGLOBIN: 12 g/dL (ref 12.0–15.0)
MCH: 28.6 pg (ref 26.0–34.0)
MCHC: 33 g/dL (ref 30.0–36.0)
MCV: 86.9 fL (ref 78.0–100.0)
Platelets: 236 10*3/uL (ref 150–400)
RBC: 4.19 MIL/uL (ref 3.87–5.11)
RDW: 16.1 % — ABNORMAL HIGH (ref 11.5–15.5)
WBC: 11.7 10*3/uL — ABNORMAL HIGH (ref 4.0–10.5)

## 2016-03-13 LAB — TYPE AND SCREEN
ABO/RH(D): O POS
ANTIBODY SCREEN: NEGATIVE

## 2016-03-13 LAB — ABO/RH: ABO/RH(D): O POS

## 2016-03-13 LAB — URINE MICROSCOPIC-ADD ON: Bacteria, UA: NONE SEEN

## 2016-03-13 MED ORDER — OXYCODONE-ACETAMINOPHEN 5-325 MG PO TABS: 1.0000 | ORAL_TABLET | ORAL | Status: DC | PRN

## 2016-03-13 MED ORDER — OXYCODONE-ACETAMINOPHEN 5-325 MG PO TABS: 2.0000 | ORAL_TABLET | ORAL | Status: DC | PRN

## 2016-03-13 MED ORDER — ACETAMINOPHEN 325 MG PO TABS
650.0000 mg | ORAL_TABLET | ORAL | Status: DC | PRN
  Administered 2016-03-14: 650 mg via ORAL
  Filled 2016-03-13: qty 2

## 2016-03-13 MED ORDER — OXYTOCIN 10 UNIT/ML IJ SOLN
2.5000 [IU]/h | INTRAMUSCULAR | Status: DC
  Administered 2016-03-14: 2.5 [IU]/h via INTRAVENOUS
  Filled 2016-03-13: qty 4

## 2016-03-13 MED ORDER — LACTATED RINGERS IV SOLN
INTRAVENOUS | Status: DC
  Administered 2016-03-14: 08:00:00 via INTRAVENOUS

## 2016-03-13 MED ORDER — CITRIC ACID-SODIUM CITRATE 334-500 MG/5ML PO SOLN: 30.0000 mL | ORAL | Status: DC | PRN

## 2016-03-13 MED ORDER — ONDANSETRON HCL 4 MG/2ML IJ SOLN
4.0000 mg | Freq: Four times a day (QID) | INTRAMUSCULAR | Status: DC | PRN
  Administered 2016-03-13: 4 mg via INTRAVENOUS
  Filled 2016-03-13: qty 2

## 2016-03-13 MED ORDER — FLEET ENEMA 7-19 GM/118ML RE ENEM: 1.0000 | ENEMA | RECTAL | Status: DC | PRN

## 2016-03-13 MED ORDER — FENTANYL CITRATE (PF) 100 MCG/2ML IJ SOLN
100.0000 ug | INTRAMUSCULAR | Status: DC | PRN
  Administered 2016-03-13 – 2016-03-14 (×2): 100 ug via INTRAVENOUS
  Filled 2016-03-13 (×2): qty 2

## 2016-03-13 MED ORDER — OXYTOCIN BOLUS FROM INFUSION: 500.0000 mL | INTRAVENOUS | Status: DC

## 2016-03-13 MED ORDER — LIDOCAINE HCL (PF) 1 % IJ SOLN
30.0000 mL | INTRAMUSCULAR | Status: DC | PRN
  Administered 2016-03-14: 30 mL via SUBCUTANEOUS
  Filled 2016-03-13: qty 30

## 2016-03-13 MED ORDER — LACTATED RINGERS IV SOLN
500.0000 mL | INTRAVENOUS | Status: DC | PRN
  Administered 2016-03-14: 500 mL via INTRAVENOUS

## 2016-03-13 NOTE — MAU Note (Signed)
Pt states that the health department diagnosed her with GDM and was instructed to change her diet but did not send her home with a BS meter or any additional instructions.

## 2016-03-13 NOTE — Progress Notes (Signed)
Assisted Registration with interpretation of medical questions.  Spanish interpreter

## 2016-03-13 NOTE — MAU Note (Signed)
Pt states she has lost her mucous plug and is having ctxs every 10 minutes. Pt states +FM, denies vaginal bleeding or LOF.

## 2016-03-13 NOTE — Progress Notes (Signed)
Checked on patients needs.  °Spanish Interpreter  °

## 2016-03-13 NOTE — H&P (Signed)
Michele Stephens is a 25 y.o. female G1 @ 40.2 wks presenting for SROM mec stained fluid on admit. Maternal Medical History:  Reason for admission: Rupture of membranes.   Contractions: Onset was 1-2 hours ago.   Frequency: rare.    Fetal activity: Perceived fetal activity is normal.   Last perceived fetal movement was within the past hour.    Prenatal complications: no prenatal complications Prenatal Complications - Diabetes: none.    OB History    Gravida Para Term Preterm AB TAB SAB Ectopic Multiple Living   1              Past Medical History  Diagnosis Date  . Medical history non-contributory   . Diabetes mellitus without complication [facility](HCC)    Past Surgical History  Procedure Laterality Date  . No past surgeries     Family History: family history is not on file. Social History:  reports that she has never smoked. She does not have any smokeless tobacco history on file. She reports that she does not drink alcohol or use illicit drugs.   Prenatal Transfer Tool  Maternal Diabetes: No Genetic Screening: Normal Maternal Ultrasounds/Referrals: Normal Fetal Ultrasounds or other Referrals:  None Maternal Substance Abuse:  No Significant Maternal Medications:  None Significant Maternal Lab Results:  None Other Comments:  None  Review of Systems  Constitutional: Negative.   HENT: Negative.   Eyes: Negative.   Respiratory: Negative.   Cardiovascular: Negative.   Gastrointestinal: Positive for abdominal pain.  Genitourinary: Negative.   Musculoskeletal: Negative.   Skin: Negative.   Neurological: Negative.   Endo/Heme/Allergies: Negative.   Psychiatric/Behavioral: Negative.     Dilation: 3 Effacement (%): 70 Station: -2 Exam by:: A. Jones RN Blood pressure 129/80, pulse 82, temperature 98.3 F (36.8 C), temperature source Oral, resp. rate 20, height 5\' 1"  (1.549 m), weight 160 lb 6.4 oz (72.757 kg), last menstrual period 05/13/2015. Maternal Exam:  Uterine  Assessment: Contraction strength is mild.  Contraction frequency is rare.   Abdomen: Patient reports no abdominal tenderness. Fetal presentation: vertex  Introitus: Normal vulva. Normal vagina.  Amniotic fluid character: meconium stained.  Pelvis: adequate for delivery.   Cervix: Cervix evaluated by digital exam.     Fetal Exam Fetal Monitor Review: Mode: ultrasound.   Variability: moderate (6-25 bpm).    Fetal State Assessment: Category I - tracings are normal.     Physical Exam  Constitutional: She is oriented to person, place, and time. She appears well-developed and well-nourished.  HENT:  Head: Normocephalic.  Eyes: Pupils are equal, round, and reactive to light.  Neck: Normal range of motion.  Cardiovascular: Normal rate, regular rhythm, normal heart sounds and intact distal pulses.   Respiratory: Effort normal and breath sounds normal.  GI: Soft. Bowel sounds are normal.  Genitourinary: Vagina normal and uterus normal.  Musculoskeletal: Normal range of motion.  Neurological: She is alert and oriented to person, place, and time. She has normal reflexes.  Skin: Skin is warm and dry.  Psychiatric: She has a normal mood and affect. Her behavior is normal. Judgment and thought content normal.    Prenatal labs: ABO, Rh:   Antibody:   Rubella:   RPR:    HBsAg:    HIV:    GBS:     Assessment/Plan: SROM gross ROM mec stained fluid. GBS neg.SVE 3cm. GBS neg admit   LAWSON, MARIE DARLENE 03/13/2016, 4:54 PM

## 2016-03-13 NOTE — Consults (Signed)
  Anesthesia Pain Consult Note  Patient: Michele JeansYadira Vasquez Rios, 25 y.o., female  Consult Requested by: Catalina AntiguaPeggy Constant, MD  Reason for Consult:CRNA Pain Management  Plan: IV Pain meds  Pain 3 Goal:5

## 2016-03-13 NOTE — Progress Notes (Signed)
LABOR PROGRESS NOTE  Michele Stephens is a 25 y.o. G1P0 at 4267w2d  admitted for SROM Spanish interpretation provided by in house spanish interpreter.  Subjective: Patient doing ok.  Did well after Fentanyl.  Would like to avoid pain medications for as long as possible.  Having discomfort but stable from previous exam.  No needs at this time.  Objective: BP 126/70 mmHg  Pulse 90  Temp(Src) 98.1 F (36.7 C) (Oral)  Resp 16  Ht 5\' 1"  (1.549 m)  Wt 160 lb 6.4 oz (72.757 kg)  BMI 30.32 kg/m2  LMP 05/13/2015 or  Filed Vitals:   03/13/16 1728 03/13/16 1857 03/13/16 2046 03/13/16 2244  BP: 130/63 126/78 126/70   Pulse: 86 82 90   Temp: 98.3 F (36.8 C) 98.3 F (36.8 C) 98.1 F (36.7 C) 98.1 F (36.7 C)  TempSrc: Oral Oral Oral Oral  Resp: 18 18 16    Height:      Weight:        Fetal monitoringBaseline: 130 bpm, Variability: Good {> 6 bpm) and Accelerations: Reactive Uterine activityFrequency: Every 2-3 minutes   Dilation: 4.5 Effacement (%): 80, 90 Cervical Position: Middle Station: -2, -1 Presentation: Vertex Exam by:: L.Mears,Rn  Labs: Lab Results  Component Value Date   WBC 11.7* 03/13/2016   HGB 12.0 03/13/2016   HCT 36.4 03/13/2016   MCV 86.9 03/13/2016   PLT 236 03/13/2016    Patient Active Problem List   Diagnosis Date Noted  . Indication for care in labor or delivery 03/13/2016    Assessment / Plan: 25 y.o. G1P0 at 4567w2d here for SROM  Labor: progressing slowly Fetal Wellbeing:  Cat 1 Pain Control:  Fentanyl IV prn, declines Epidural at this time Anticipated MOD:  SVD  Delynn FlavinAshly Kaiah Hosea, DO PGY-2, [facility] 03/13/2016, 11:18 PM

## 2016-03-13 NOTE — Progress Notes (Signed)
Michele Stephens is a 89[name] y.o. G1P0 at 4388w2d by ultrasound admitted for rupture of membranes  Subjective:   Objective: BP 130/63 mmHg  Pulse 86  Temp(Src) 98.3 F (36.8 C) (Oral)  Resp 18  Ht 5\' 1"  (1.549 m)  Wt 160 lb 6.4 oz (72.757 kg)  BMI 30.32 kg/m2  LMP 05/13/2015      FHT:  FHR: 135 bpm, variability: moderate,  accelerations:  Present,  decelerations:  Present variable UC:   regular, every 2-3 minutes SVE:   Dilation: 4 Effacement (%): 80 Station: -2 Exam by:: Michele SlimH. Mitchell, RN  Labs: Lab Results  Component Value Date   WBC 11.7* 03/13/2016   HGB 12.0 03/13/2016   HCT 36.4 03/13/2016   MCV 86.9 03/13/2016   PLT 236 03/13/2016    Assessment / Plan: Spontaneous labor, progressing normally  Labor: Progressing normally Preeclampsia:  no signs or symptoms of toxicity and intake and ouput balanced Fetal Wellbeing:  Category I Pain Control:  IV pain meds I/D:  n/a Anticipated MOD:  NSVD  Michele Stephens Michele Stephens 03/13/2016, 6:40 PM

## 2016-03-13 NOTE — Progress Notes (Signed)
Assisted RN with interpretation of medical information for admit to L&D Spanish Interpreter

## 2016-03-14 ENCOUNTER — Encounter (HOSPITAL_COMMUNITY): Payer: Self-pay

## 2016-03-14 ENCOUNTER — Inpatient Hospital Stay (HOSPITAL_COMMUNITY): Payer: Medicaid Other | Admitting: Anesthesiology

## 2016-03-14 DIAGNOSIS — Z3A40 40 weeks gestation of pregnancy: Secondary | ICD-10-CM

## 2016-03-14 DIAGNOSIS — O411230 Chorioamnionitis, third trimester, not applicable or unspecified: Secondary | ICD-10-CM

## 2016-03-14 DIAGNOSIS — O480 Post-term pregnancy: Secondary | ICD-10-CM

## 2016-03-14 DIAGNOSIS — O4292 Full-term premature rupture of membranes, unspecified as to length of time between rupture and onset of labor: Secondary | ICD-10-CM

## 2016-03-14 LAB — RPR: RPR Ser Ql: NONREACTIVE

## 2016-03-14 MED ORDER — PRENATAL MULTIVITAMIN CH
1.0000 | ORAL_TABLET | Freq: Every day | ORAL | Status: DC
  Administered 2016-03-15 – 2016-03-16 (×2): 1 via ORAL
  Filled 2016-03-14 (×2): qty 1

## 2016-03-14 MED ORDER — PHENYLEPHRINE 40 MCG/ML (10ML) SYRINGE FOR IV PUSH (FOR BLOOD PRESSURE SUPPORT)
80.0000 ug | PREFILLED_SYRINGE | INTRAVENOUS | Status: DC | PRN
  Filled 2016-03-14: qty 20

## 2016-03-14 MED ORDER — DIPHENHYDRAMINE HCL 25 MG PO CAPS: 25.0000 mg | ORAL_CAPSULE | Freq: Four times a day (QID) | ORAL | Status: DC | PRN

## 2016-03-14 MED ORDER — SIMETHICONE 80 MG PO CHEW: 80.0000 mg | CHEWABLE_TABLET | ORAL | Status: DC | PRN

## 2016-03-14 MED ORDER — TERBUTALINE SULFATE 1 MG/ML IJ SOLN
0.2500 mg | Freq: Once | INTRAMUSCULAR | Status: AC
  Administered 2016-03-14: 0.25 mg via SUBCUTANEOUS
  Filled 2016-03-14: qty 1

## 2016-03-14 MED ORDER — SODIUM CHLORIDE 0.9 % IV SOLN
1.0000 g | INTRAVENOUS | Status: AC
  Administered 2016-03-14 – 2016-03-15 (×8): 1 g via INTRAVENOUS
  Filled 2016-03-14 (×11): qty 1000

## 2016-03-14 MED ORDER — DIPHENHYDRAMINE HCL 50 MG/ML IJ SOLN: 12.5000 mg | INTRAMUSCULAR | Status: DC | PRN

## 2016-03-14 MED ORDER — ACETAMINOPHEN 325 MG PO TABS: 650.0000 mg | ORAL_TABLET | ORAL | Status: DC | PRN

## 2016-03-14 MED ORDER — WITCH HAZEL-GLYCERIN EX PADS: 1.0000 "application " | MEDICATED_PAD | CUTANEOUS | Status: DC | PRN

## 2016-03-14 MED ORDER — ONDANSETRON HCL 4 MG PO TABS: 4.0000 mg | ORAL_TABLET | ORAL | Status: DC | PRN

## 2016-03-14 MED ORDER — EPHEDRINE 5 MG/ML INJ: 10.0000 mg | INTRAVENOUS | Status: DC | PRN

## 2016-03-14 MED ORDER — DIBUCAINE 1 % RE OINT
1.0000 "application " | TOPICAL_OINTMENT | RECTAL | Status: DC | PRN
  Filled 2016-03-14: qty 28

## 2016-03-14 MED ORDER — LIDOCAINE HCL (PF) 1 % IJ SOLN
INTRAMUSCULAR | Status: DC | PRN
  Administered 2016-03-14 (×2): 5 mL

## 2016-03-14 MED ORDER — LANOLIN HYDROUS EX OINT: TOPICAL_OINTMENT | CUTANEOUS | Status: DC | PRN

## 2016-03-14 MED ORDER — PHENYLEPHRINE 40 MCG/ML (10ML) SYRINGE FOR IV PUSH (FOR BLOOD PRESSURE SUPPORT): 80.0000 ug | PREFILLED_SYRINGE | INTRAVENOUS | Status: DC | PRN

## 2016-03-14 MED ORDER — FENTANYL 2.5 MCG/ML BUPIVACAINE 1/10 % EPIDURAL INFUSION (WH - ANES)
14.0000 mL/h | INTRAMUSCULAR | Status: DC | PRN
  Administered 2016-03-14 (×2): 14 mL/h via EPIDURAL
  Filled 2016-03-14 (×2): qty 125

## 2016-03-14 MED ORDER — TETANUS-DIPHTH-ACELL PERTUSSIS 5-2.5-18.5 LF-MCG/0.5 IM SUSP
0.5000 mL | Freq: Once | INTRAMUSCULAR | Status: DC
  Filled 2016-03-14: qty 0.5

## 2016-03-14 MED ORDER — IBUPROFEN 600 MG PO TABS
600.0000 mg | ORAL_TABLET | Freq: Four times a day (QID) | ORAL | Status: DC
  Administered 2016-03-14 – 2016-03-16 (×7): 600 mg via ORAL
  Filled 2016-03-14 (×7): qty 1

## 2016-03-14 MED ORDER — ACETAMINOPHEN 500 MG PO TABS
1000.0000 mg | ORAL_TABLET | Freq: Four times a day (QID) | ORAL | Status: DC | PRN
  Administered 2016-03-14: 1000 mg via ORAL
  Filled 2016-03-14: qty 2

## 2016-03-14 MED ORDER — SENNOSIDES-DOCUSATE SODIUM 8.6-50 MG PO TABS
2.0000 | ORAL_TABLET | ORAL | Status: DC
  Administered 2016-03-15 – 2016-03-16 (×2): 2 via ORAL
  Filled 2016-03-14 (×2): qty 2

## 2016-03-14 MED ORDER — ONDANSETRON HCL 4 MG/2ML IJ SOLN: 4.0000 mg | INTRAMUSCULAR | Status: DC | PRN

## 2016-03-14 MED ORDER — MEASLES, MUMPS & RUBELLA VAC ~~LOC~~ INJ
0.5000 mL | INJECTION | Freq: Once | SUBCUTANEOUS | Status: DC
  Filled 2016-03-14: qty 0.5

## 2016-03-14 MED ORDER — ZOLPIDEM TARTRATE 5 MG PO TABS: 5.0000 mg | ORAL_TABLET | Freq: Every evening | ORAL | Status: DC | PRN

## 2016-03-14 MED ORDER — AMPICILLIN SODIUM 2 G IJ SOLR
2.0000 g | Freq: Once | INTRAMUSCULAR | Status: AC
Start: 2016-03-14 — End: 2016-03-14
  Administered 2016-03-14: 2 g via INTRAVENOUS
  Filled 2016-03-14: qty 2000

## 2016-03-14 MED ORDER — BENZOCAINE-MENTHOL 20-0.5 % EX AERO
1.0000 "application " | INHALATION_SPRAY | CUTANEOUS | Status: DC | PRN
  Administered 2016-03-14: 1 via TOPICAL
  Filled 2016-03-14 (×2): qty 56

## 2016-03-14 MED ORDER — LACTATED RINGERS IV SOLN
500.0000 mL | Freq: Once | INTRAVENOUS | Status: AC
  Administered 2016-03-14: 500 mL via INTRAVENOUS

## 2016-03-14 MED ORDER — GENTAMICIN SULFATE 40 MG/ML IJ SOLN
140.0000 mg | Freq: Three times a day (TID) | INTRAMUSCULAR | Status: AC
  Administered 2016-03-14 – 2016-03-15 (×5): 140 mg via INTRAVENOUS
  Filled 2016-03-14 (×6): qty 3.5

## 2016-03-14 NOTE — Progress Notes (Signed)
LABOR PROGRESS NOTE  Michele Stephens is a 25 y.o. G1P0 at 7034w2d  admitted for SROM  Subjective: Sleeping. NAD. Epidural in place.  Had a fever to 102.22F this am.    Objective: BP 118/68 mmHg  Pulse 108  Temp(Src) 102.1 F (38.9 C) (Axillary)  Resp 18  Ht 5\' 1"  (1.549 m)  Wt 160 lb 6.4 oz (72.757 kg)  BMI 30.32 kg/m2  SpO2 100%  LMP 05/13/2015 or  Filed Vitals:   03/14/16 0401 03/14/16 0431 03/14/16 0500 03/14/16 0501  BP: 122/61 118/61 118/68 118/68  Pulse: 87 95 108 108  Temp:   100.4 F (38 C) 102.1 F (38.9 C)  TempSrc:   Oral Axillary  Resp: 16   18  Height:      Weight:      SpO2:       Gen: patient resting in bed, nondiaphoretic Fetal monitoringBaseline: 155 bpm, Variability: Fair (1-6 bpm), Accelerations: Reactive and Decelerations: Early Uterine activityFrequency: Every 2-3 minutes   Dilation: 5.5 Effacement (%): 80, 90 Cervical Position: Middle Station: -1 Presentation: Vertex Exam by:: L.Mears,RN  Labs: Lab Results  Component Value Date   WBC 11.7* 03/13/2016   HGB 12.0 03/13/2016   HCT 36.4 03/13/2016   MCV 86.9 03/13/2016   PLT 236 03/13/2016    Patient Active Problem List   Diagnosis Date Noted  . Indication for care in labor or delivery 03/13/2016    Assessment / Plan: 25 y.o. G1P0 at 6434w2d here for SROM.  Continue Amp and Gent for maternal fever and heavy mec stained fluid.  Fever this am to 102.22F.  Tylenol given.  Approaching 24 hour marker for ROM.  Vaginal swelling much improved compared to previous exam.  Labor: expectant management Fetal Wellbeing:  Cat 2 Pain Control:  Epidural Anticipated MOD:  SVD  Delynn FlavinAshly Areona Homer, DO PGY-2, [facility] 03/14/2016, 5:31 AM

## 2016-03-14 NOTE — Progress Notes (Signed)
Pharmacy Antibiotic Note  Michele JeansYadira Vasquez Stephens is a 25 y.o. female admitted on 03/13/2016 with meconium stained ROM.  Pharmacy has been consulted for Gentamicin dosing due to maternal temp during labor and prolonged ROM.  Plan: 1. Gentamicin 140mg  IV q8h. 2. Will continue to follow and draw SCr if continued postpartum. 3. Will assess need for further kinetic workup based on pt's clincal status and duration of therapy.  Height: 5\' 1"  (154.9 cm) Weight: 160 lb 6.4 oz (72.757 kg) IBW/kg (Calculated) : 47.8  Temp (24hrs), Avg:98.6 F (37 C), Min:98.1 F (36.7 C), Max:100.3 F (37.9 C)   Recent Labs Lab 03/13/16 1639  WBC 11.7*    CrCl cannot be calculated (Patient has no serum creatinine result on file.).    No Known Allergies  Antimicrobials this admission: Ampicillin 2 gram IV x 1 then 1 gram IV q4h  4/3 >>      Microbiology results:   Thank you for allowing pharmacy to be a part of this patient's care.  Claybon Jabsngel, Gorden Stthomas G 03/14/2016 12:14 AM

## 2016-03-14 NOTE — Progress Notes (Signed)
Interim note  Called by RN for maternal fever to 100.81F.  Patient nearing Prolonged rupture of membranes, as she reports SROM around 8-9am.  Discussed care with Zerita Boersarlene Lawson, CNM.  She is recommending starting Ampicillin and Gent.  Amp 2g x1 followed by 1g q4 hours Natasha BenceGent per pharmacy Continue to monitor closely  Krystyl Cannell M. Nadine CountsGottschalk, DO PGY-2, [facility]

## 2016-03-14 NOTE — Anesthesia Procedure Notes (Signed)
Epidural Patient location during procedure: OB  Staffing Anesthesiologist: Thomson Herbers Performed by: anesthesiologist   Preanesthetic Checklist Completed: patient identified, site marked, surgical consent, pre-op evaluation, timeout performed, IV checked, risks and benefits discussed and monitors and equipment checked  Epidural Patient position: sitting Prep: DuraPrep Patient monitoring: heart rate, continuous pulse ox and blood pressure Approach: midline Location: L3-L4 Injection technique: LOR saline  Needle:  Needle type: Tuohy  Needle gauge: 17 G Needle length: 9 cm and 9 Needle insertion depth: 4 cm Catheter type: closed end flexible Catheter size: 20 Guage Catheter at skin depth: 9 cm Test dose: negative  Assessment Events: blood not aspirated, injection not painful, no injection resistance, negative IV test and no paresthesia  Additional Notes Patient identified. Risks/Benefits/Options discussed with patient including but not limited to bleeding, infection, nerve damage, paralysis, failed block, incomplete pain control, headache, blood pressure changes, nausea, vomiting, reactions to medication both or allergic, itching and postpartum back pain. Confirmed with bedside nurse the patient's most recent platelet count. Confirmed with patient that they are not currently taking any anticoagulation, have any bleeding history or any family history of bleeding disorders. Patient expressed understanding and wished to proceed. All questions were answered. Sterile technique was used throughout the entire procedure. Please see nursing notes for vital signs. Test dose was given through epidural needle and negative prior to continuing to dose epidural or start infusion. Warning signs of high block given to the patient including shortness of breath, tingling/numbness in hands, complete motor block, or any concerning symptoms with instructions to call for help. Patient was given  instructions on fall risk and not to get out of bed. All questions and concerns addressed with instructions to call with any issues.   

## 2016-03-14 NOTE — Progress Notes (Signed)
Introduced self to patient, explained medications, accompanied patient to bathroom, answered questions with hospital spanish interpreter.

## 2016-03-14 NOTE — Progress Notes (Signed)
Flushed NSL prior to starting antibiotic infusion; patient c/o pain at site, slight swelling noted.  Called T/S MD who advised to start new IV.

## 2016-03-14 NOTE — Progress Notes (Signed)
LABOR PROGRESS NOTE  Michele Stephens is a 25 y.o. G1P0 at 447w2d  admitted for SROM  Subjective: Sleeping. NAD. Epidural in place.  Objective: BP 116/68 mmHg  Pulse 107  Temp(Src) 100.1 F (37.8 C) (Axillary)  Resp 16  Ht 5\' 1"  (1.549 m)  Wt 160 lb 6.4 oz (72.757 kg)  BMI 30.32 kg/m2  SpO2 100%  LMP 05/13/2015 or  Filed Vitals:   03/14/16 0231 03/14/16 0235 03/14/16 0236 03/14/16 0238  BP: 122/72  116/68 116/68  Pulse: 103 96 107 107  Temp:      TempSrc:      Resp:      Height:      Weight:      SpO2:  100%  100%   Gen: patient resting in bed, nondiaphoretic Fetal monitoringBaseline: 140 bpm, Variability: Fair (1-6 bpm) and Decelerations: Early Uterine activityFrequency: Every 2-4 minutes   Dilation: 4 Effacement (%): 80 Cervical Position: Middle Station: -1 Presentation: Vertex Exam by:: D.Lawson,CNM  Labs: Lab Results  Component Value Date   WBC 11.7* 03/13/2016   HGB 12.0 03/13/2016   HCT 36.4 03/13/2016   MCV 86.9 03/13/2016   PLT 236 03/13/2016    Patient Active Problem List   Diagnosis Date Noted  . Indication for care in labor or delivery 03/13/2016    Assessment / Plan: 25 y.o. G1P0 at 2747w2d here for SROM.  Continue Amp and Gent for maternal fever and heavy mec stained fluid.  Labor: expectant management Fetal Wellbeing:  Cat 2 Pain Control:  Epidural Anticipated MOD:  SVD  Delynn FlavinAshly Tylerjames Hoglund, DO PGY-2, [facility] 03/14/2016, 3:43 AM

## 2016-03-14 NOTE — Progress Notes (Signed)
LABOR PROGRESS NOTE  Michele Stephens is a 25 y.o. G1P0 at 157w3d  admitted for srom  Subjective: febrile  Objective: BP 110/52 mmHg  Pulse 128  Temp(Src) 102 F (38.9 C) (Oral)  Resp 20  Ht 5\' 1"  (1.549 m)  Wt 160 lb 6.4 oz (72.757 kg)  BMI 30.32 kg/m2  SpO2 100%  LMP 05/13/2015 or  Filed Vitals:   03/14/16 0731 03/14/16 0744 03/14/16 0803 03/14/16 0831  BP: 122/65  107/39 110/52  Pulse: 106  131 128  Temp:  102 F (38.9 C)    TempSrc:  Oral    Resp:   20   Height:      Weight:      SpO2:        145/mod/+a/occasional late  Dilation: 6.5 Effacement (%): 90 Cervical Position: Middle Station: -2 Presentation: Vertex Exam by:: Dr. Ashok PallWouk  Labs: Lab Results  Component Value Date   WBC 11.7* 03/13/2016   HGB 12.0 03/13/2016   HCT 36.4 03/13/2016   MCV 86.9 03/13/2016   PLT 236 03/13/2016    Patient Active Problem List   Diagnosis Date Noted  . Indication for care in labor or delivery 03/13/2016    Assessment / Plan: 25 y.o. G1P0 at 207w3d here for srom, now 24 hours, with triple I  Labor: progressing w/o augmentation Fetal Wellbeing:  Cat 2, s/p recurrent lates treated w/ terbutaline, repositioning, bolus, and O2. Strip improved for now Pain Control:  S/p epidural  Anticipated MOD:  Hopeful for vaginal Triple I: remains febrile. On amp/gent  Silvano BilisNoah B Chelci [place], MD 03/14/2016, 8:50 AM

## 2016-03-14 NOTE — Progress Notes (Signed)
I was present during the delivery with Dr Perrin MalteseBedford, RN Mikle Bosworthanya Willis and Resident Raliegh IpAshly M Gottschalk.by Orlan LeavensViria Alvarez

## 2016-03-14 NOTE — Anesthesia Preprocedure Evaluation (Signed)
Anesthesia Evaluation  Patient identified by MRN, date of birth, ID band Patient awake    Reviewed: Allergy & Precautions, H&P , NPO status , Patient's Chart, lab work & pertinent test results  History of Anesthesia Complications Negative for: history of anesthetic complications  Airway Mallampati: II  TM Distance: >3 FB Neck ROM: full    Dental no notable dental hx. (+) Teeth Intact   Pulmonary neg pulmonary ROS,    Pulmonary exam normal breath sounds clear to auscultation       Cardiovascular negative cardio ROS Normal cardiovascular exam Rhythm:regular Rate:Normal     Neuro/Psych negative neurological ROS  negative psych ROS   GI/Hepatic negative GI ROS, Neg liver ROS,   Endo/Other  negative endocrine ROSdiabetes  Renal/GU negative Renal ROS  negative genitourinary   Musculoskeletal   Abdominal   Peds  Hematology negative hematology ROS (+)   Anesthesia Other Findings   Reproductive/Obstetrics (+) Pregnancy                             Anesthesia Physical Anesthesia Plan  ASA: II  Anesthesia Plan: Epidural   Post-op Pain Management:    Induction:   Airway Management Planned:   Additional Equipment:   Intra-op Plan:   Post-operative Plan:   Informed Consent: I have reviewed the patients History and Physical, chart, labs and discussed the procedure including the risks, benefits and alternatives for the proposed anesthesia with the patient or authorized representative who has indicated his/her understanding and acceptance.     Plan Discussed with:   Anesthesia Plan Comments:         Anesthesia Quick Evaluation  

## 2016-03-14 NOTE — Progress Notes (Signed)
LABOR PROGRESS NOTE  Michele Stephens is a 25 y.o. G1P0 at 9229w2d  admitted for SROM  Subjective: Patient resting in bed.  Doing better after Fentanyl.  Now planning for epidural.  Has not yet been placed.   Per RN report vaginal tissue/cervix swollen.  CNM placed IUPC at around 130am.  Objective: BP 120/75 mmHg  Pulse 93  Temp(Src) 100.1 F (37.8 C) (Axillary)  Resp 16  Ht 5\' 1"  (1.549 m)  Wt 160 lb 6.4 oz (72.757 kg)  BMI 30.32 kg/m2  SpO2 100%  LMP 05/13/2015 or  Filed Vitals:   03/14/16 0205 03/14/16 0206 03/14/16 0211 03/14/16 0214  BP: 134/68  124/77 120/75  Pulse: 105 101 102 93  Temp:      TempSrc:      Resp:   16   Height:      Weight:      SpO2: 100%   100%   Gen: patient resting in bed, nondiaphoretic Fetal monitoringBaseline: 130 bpm, Variability: Fair (1-6 bpm) and Accelerations: Reactive Uterine activityFrequency: Every 1.5-3 minutes   Dilation: 4 Effacement (%): 80 Cervical Position: Middle Station: -1 Presentation: Vertex Exam by:: D.Lawson,CNM  Labs: Lab Results  Component Value Date   WBC 11.7* 03/13/2016   HGB 12.0 03/13/2016   HCT 36.4 03/13/2016   MCV 86.9 03/13/2016   PLT 236 03/13/2016    Patient Active Problem List   Diagnosis Date Noted  . Indication for care in labor or delivery 03/13/2016    Assessment / Plan: 25 y.o. G1P0 at 3629w2d here for SROM.  Now on Amp and Gent for maternal fever and heavy mec stained fluid.  Labor: progressing slowly. Moderate vaginal swelling.   Fetal Wellbeing:  Cat 2, s/p Fentanyl IV Pain Control:  Fentanyl IV prn, Epidural ordered Anticipated MOD:  SVD  Delynn FlavinAshly Gottschalk, DO PGY-2, [facility] 03/14/2016, 2:24 AM

## 2016-03-15 NOTE — Anesthesia Postprocedure Evaluation (Signed)
Anesthesia Post Note  Patient: Michele Stephens  Procedure(s) Performed: * No procedures listed *  Patient location during evaluation: Mother Baby Anesthesia Type: Epidural Level of consciousness: awake and alert and oriented Pain management: satisfactory to patient Vital Signs Assessment: post-procedure vital signs reviewed and stable Respiratory status: spontaneous breathing and nonlabored ventilation Cardiovascular status: stable Postop Assessment: no headache, no backache, no signs of nausea or vomiting, adequate PO intake and patient able to bend at knees (patient up walking) Anesthetic complications: no    Last Vitals:  Filed Vitals:   03/15/16 0800 03/15/16 1756  BP: 110/68 113/68  Pulse: 100 71  Temp: 36.3 C 36.6 C  Resp: 18 18    Last Pain:  Filed Vitals:   03/15/16 1757  PainSc: 0-No pain                 Amandy Chubbuck

## 2016-03-15 NOTE — Progress Notes (Signed)
Post Partum Day 1 Subjective:  Michele Stephens is a 25 y.o. G1P1001 7945w3d s/p SVD.  No acute events overnight.  Pt denies problems with ambulating, voiding or po intake.  She denies nausea or vomiting.  Pain is well controlled.  She has had flatus. She has not had bowel movement.  Lochia Small.  Plan for birth control is undecided.  Method of Feeding: breast  Spanish interpretation provided by [employer] Interpreter Eddie ID [phone]  Objective: Blood pressure 103/61, pulse 76, temperature 97.7 F (36.5 C), temperature source Oral, resp. rate 18, height 5\' 1"  (1.549 m), weight 160 lb 6.4 oz (72.757 kg), last menstrual period 05/13/2015, SpO2 99 %, unknown if currently breastfeeding.  Physical Exam:  General: alert, cooperative and no distress Lochia:normal flow Chest: CTAB Heart: RRR no m/r/g Abdomen: +BS, soft, nontender,  Uterine Fundus: firm, below level of umbilicus DVT Evaluation: No evidence of DVT seen on physical exam. Extremities: trace pedal edema   Recent Labs  03/13/16 1639  HGB 12.0  HCT 36.4    Assessment/Plan:  ASSESSMENT: Michele Stephens is a 25 y.o. G1P1001 3845w3d s/p SVD  Plan for discharge tomorrow and Breastfeeding   LOS: 2 days   Delynn FlavinAshly Soniya Ashraf, DO 03/15/2016, 7:32 AM

## 2016-03-15 NOTE — Progress Notes (Signed)
Answered questions with hospital spanish interpreter.

## 2016-03-15 NOTE — Lactation Note (Signed)
This note was copied from a baby's chart. Lactation Consultation Note; [facility] Oswaldo DoneHector [phone] interpreting for me. Mom reports baby has been latching well with no pain. Last fed about 1 hour ago and is asleep in mom's arms at present. Reviewed feeding cues and encouraged to feed whenever she sees them. Mom has given some formula because she didn't have enough milk. Encouraged to always breast feed first both breasts then given formula if baby still hungry. No questions at present. To call for assist prn  Patient Name: Michele Stephens EAVWU'JToday's Date: 03/15/2016 Reason for consult: Initial assessment   Maternal Data Formula Feeding for Exclusion: Yes Reason for exclusion: Mother's choice to formula and breast feed on admission Does the patient have breastfeeding experience prior to this delivery?: No  Feeding Feeding Type: Formula Nipple Type: Slow - flow  LATCH Score/Interventions                      Lactation Tools Discussed/Used     Consult Status Consult Status: Follow-up Date: 03/16/16 Follow-up type: In-patient    Pamelia HoitWeeks, Carlee Tesfaye D 03/15/2016, 12:06 PM

## 2016-03-16 LAB — GLUCOSE, CAPILLARY: Glucose-Capillary: 79 mg/dL (ref 65–99)

## 2016-03-16 MED ORDER — IBUPROFEN 600 MG PO TABS: 600.0000 mg | ORAL_TABLET | Freq: Four times a day (QID) | ORAL | Status: DC

## 2016-03-16 NOTE — Discharge Summary (Signed)
OB Discharge Summary  Patient Name: Michele Stephens DOB: 10-Feb-1991 MRN: [id number]  Date of admission: 03/13/2016 Delivering MD: Shonna ChockWOUK, Arzu Mcgaughey BEDFORD   Date of discharge: 03/16/2016  Admitting diagnosis: 40.2 WKS, CTXS Intrauterine pregnancy: 5215w3d     Secondary diagnosis:Active Problems:   Indication for care in labor or delivery  Additional problems: Developed Triple I. Had temperature to 102.7 prior to delivery and a temperature to 100.6 one hour after delivery. She was treated with Amp/Gent antepartum and continued for 24 hours postpartum and was otherwise afebrile w/o signs endometritis at d/c.    Discharge diagnosis: Term Pregnancy Delivered                                                                     Post partum procedures:none  Augmentation: Pitocin  Complications: Intrauterine Inflammation or infection (Chorioamniotis)  Hospital course:  Onset of Labor With Vaginal Delivery     25 y.o. yo G1P1001 at 2015w3d was admitted in Active Labor after SROM with meconium-stained fluid on 03/13/2016. Patient had an uncomplicated labor course as follows:  Membrane Rupture Time/Date: 9:00 AM ,03/13/2016   Intrapartum Procedures: Episiotomy: None [1]                                         Lacerations:  Labial [10]  Patient had a delivery of a Viable infant. 03/14/2016  Information for the patient's newborn:  Michele Stephens, Girl Michele Stephens [[id number]][[id number]]  Delivery Method: Vaginal, Spontaneous Delivery (Filed from Delivery Summary)    Pateint had an uncomplicated postpartum course.  She is ambulating, tolerating a regular diet, passing flatus, and urinating well. Patient is discharged home in stable condition on 03/16/2016.    Physical exam  Filed Vitals:   03/15/16 0520 03/15/16 0800 03/15/16 1756 03/16/16 0612  BP: 103/61 110/68 113/68 112/63  Pulse: 76 100 71 71  Temp: 97.7 F (36.5 C) 97.3 F (36.3 C) 97.9 F (36.6 C) 97.6 F (36.4 C)  TempSrc: Oral Oral Oral Oral  Resp:  18 18 18 17   Height:      Weight:      SpO2:       General: alert, cooperative and no distress Lochia: appropriate Uterine Fundus: firm Incision: N/A DVT Evaluation: No evidence of DVT seen on physical exam. Labs: Lab Results  Component Value Date   WBC 11.7* 03/13/2016   HGB 12.0 03/13/2016   HCT 36.4 03/13/2016   MCV 86.9 03/13/2016   PLT 236 03/13/2016   No flowsheet data found.  Discharge instruction: per After Visit Summary and "Baby and Me Booklet".  After Visit Meds:    Medication List    TAKE these medications        COMPLETE PRENATAL/DHA 30-0.975 & 300 MG Misc  Take 1 tablet by mouth daily.     ibuprofen 600 MG tablet  Commonly known as:  ADVIL,MOTRIN  Take 1 tablet (600 mg total) by mouth every 6 (six) hours.        Diet: routine diet  Activity: Advance as tolerated. Pelvic rest for 6 weeks.   Outpatient follow up:6 weeks Follow up Appt:No future appointments.  Follow up visit: No Follow-up on file.  Postpartum contraception: Combination OCPs to start @ 6 wks  Newborn Data: Live born female  Birth Weight: 7 lb 9.2 oz (3435 g) APGAR: 7, 9  Baby Feeding: Breast Disposition:home with mother   03/16/2016 Hilton Sinclair, MD

## 2016-03-16 NOTE — Lactation Note (Signed)
This note was copied from a baby's chart. Lactation Consultation Note  Patient Name: Michele Stephens ZOXWR'UToday's Date: 03/16/2016 Reason for consult: Follow-up assessment Baby at 49 hr of life and mom does not think she has enough milk so she is offering formula. Demonstrated manual expression, large drops of colostrum noted. Given harmony, reviewed engorgement prevention/treatment. Discussed baby behavior, feeding frequency, baby belly size, voids, wt loss, breast changes, and nipple care. She is aware of OP services and support group. [facility]      Maternal Data    Feeding Feeding Type: Breast Fed Length of feed: 45 min  LATCH Score/Interventions Latch: Grasps breast easily, tongue down, lips flanged, rhythmical sucking.  Audible Swallowing: Spontaneous and intermittent Intervention(s): Skin to skin;Hand expression  Type of Nipple: Everted at rest and after stimulation  Comfort (Breast/Nipple): Soft / non-tender     Hold (Positioning): Assistance needed to correctly position infant at breast and maintain latch. Intervention(s): Position options  LATCH Score: 9  Lactation Tools Discussed/Used WIC Program: Yes Pump Review: Setup, frequency, and cleaning;Milk Storage Initiated by:: ES Date initiated:: 03/16/16   Consult Status Consult Status: PRN Follow-up type: Out-patient    Rulon Eisenmengerlizabeth E Maxden Naji 03/16/2016, 5:04 PM

## 2016-03-16 NOTE — Discharge Instructions (Signed)
Parto vaginal, Cuidados posteriores  °(Vaginal Delivery, Care After) °Siga estas instrucciones durante las próximas semanas. Estas indicaciones para el alta le proporcionan información general acerca de cómo deberá cuidarse después del parto. El médico también podrá darle instrucciones específicas. El tratamiento ha sido planificado según las prácticas médicas actuales, pero en algunos casos pueden ocurrir problemas. Comuníquese con el médico si tiene algún problema o tiene preguntas al volver a su casa.  °INSTRUCCIONES PARA EL CUIDADO EN EL HOGAR  °· Tome sólo medicamentos de venta libre o recetados, según las indicaciones del médico o del farmacéutico. °· No beba alcohol, especialmente si está amamantando o toma analgésicos. °· No mastique tabaco ni fume. °· No consuma drogas. °· Continúe con un adecuado cuidado perineal. El buen cuidado perineal incluye: °¨ Higienizarse de adelante hacia atrás. °¨ Mantener la zona perineal limpia. °· No use tampones ni duchas vaginales hasta que su médico la autorice. °· Dúchese, lávese el cabello y tome baños de inmersión según las indicaciones de su médico. °· Utilice un sostén que le ajuste bien y que brinde buen soporte a sus mamas. °· Consuma alimentos saludables. °· Beba suficiente líquido para mantener la orina clara o de color amarillo pálido. °· Consuma alimentos ricos en fibra como cereales y panes integrales, arroz, frijoles y frutas y verduras frescas todos los días. Estos alimentos pueden ayudarla a prevenir o aliviar el estreñimiento. °· Siga las recomendaciones de su médico relacionadas con la reanudación de actividades como subir escaleras, conducir automóviles, levantar objetos, hacer ejercicios o viajar. °· Hable con su médico acerca de reanudar la actividad sexual. Volver a la actividad sexual depende del riesgo de infección, la velocidad de la curación y la comodidad y su deseo de reanudarla. °· Trate de que alguien la ayude con las actividades del hogar y con  el recién nacido al menos durante un par de días después de salir del hospital. °· Descanse todo lo que pueda. Trate de descansar o tomar una siesta mientras el bebé está durmiendo. °· Aumente sus actividades gradualmente. °· Cumpla con todas las visitas de control programadas para después del parto. Es muy importante asistir a todas las citas programadas de seguimiento. En estas citas, su médico va a controlarla para asegurarse de que esté sanando física y emocionalmente. °SOLICITE ATENCIÓN MÉDICA SI:  °· Elimina coágulos grandes por la vagina. Guarde algunos coágulos para mostrarle al médico. °· Tiene una secreción con feo olor que proviene de la vagina. °· Tiene dificultad para orinar. °· Orina con frecuencia. °· Siente dolor al orinar. °· Nota un cambio en sus movimientos intestinales. °· Aumenta el enrojecimiento, el dolor o la hinchazón en la zona de la incisión vaginal (episiotomía) o el desgarro vaginal. °· Tiene pus que drena por la episiotomía o el desgarro vaginal. °· La episiotomía o el desgarro vaginal se abren. °· Sus mamas le duelen, están duras o enrojecidas. °· Sufre un dolor intenso de cabeza. °· Tiene visión borrosa o ve manchas. °· Se siente triste o deprimida. °· Tiene pensamientos acerca de lastimarse o dañar al recién nacido. °· Tiene preguntas acerca de su cuidado personal, el cuidado del recién nacido o acerca de los medicamentos. °· Se siente mareada o sufre un desmayo. °· Tiene una erupción. °· Tiene náuseas o vómitos. °· Usted amamantó al bebé y no ha tenido su período menstrual dentro de las 12 semanas después de dejar de amamantar. °· No amamanta al bebé y no tuvo su período menstrual en las últimas 12° semanas después del   parto. °· Tiene fiebre. °SOLICITE ATENCIÓN MÉDICA DE INMEDIATO SI:  °· Siente dolor persistente. °· Siente dolor en el pecho. °· Le falta el aire. °· Se desmaya. °· Siente dolor en la pierna. °· Siente dolor en el estómago. °· El sangrado vaginal satura dos o más  apósitos en 1 hora. °  °Esta información no tiene como fin reemplazar el consejo del médico. Asegúrese de hacerle al médico cualquier pregunta que tenga. °  °Document Released: 11/28/2005 Document Revised: 08/19/2015 °Elsevier Interactive Patient Education ©2016 [employer] ° °

## 2016-03-16 NOTE — Progress Notes (Signed)
Video Interpreter used. Spoke with [phone]#[id number]

## 2016-03-19 ENCOUNTER — Inpatient Hospital Stay (HOSPITAL_COMMUNITY): Admission: RE | Admit: 2016-03-19 | Payer: Medicaid Other | Source: Ambulatory Visit

## 2017-03-01 IMAGING — US US MFM OB COMP +14 WKS
1 series · 14 of 28 positions shown · non-contrast
Comparison: none

[Series 1: us mfm ob comp +14 wks · 56 acquisitions, 14 frames shown]
[im 3/56]
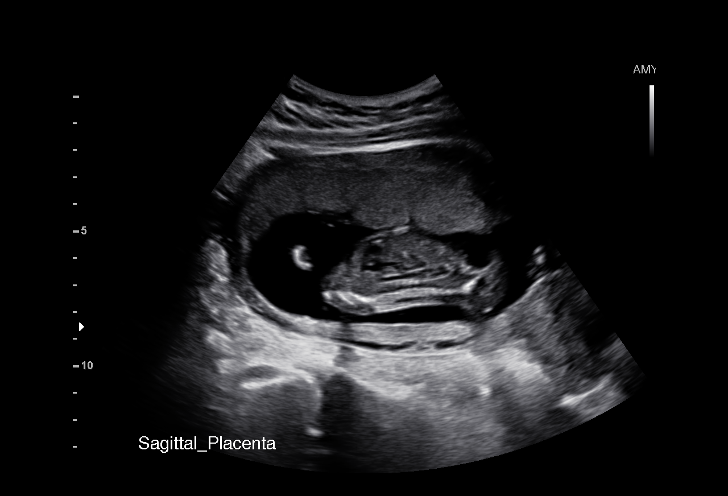
[im 7/56]
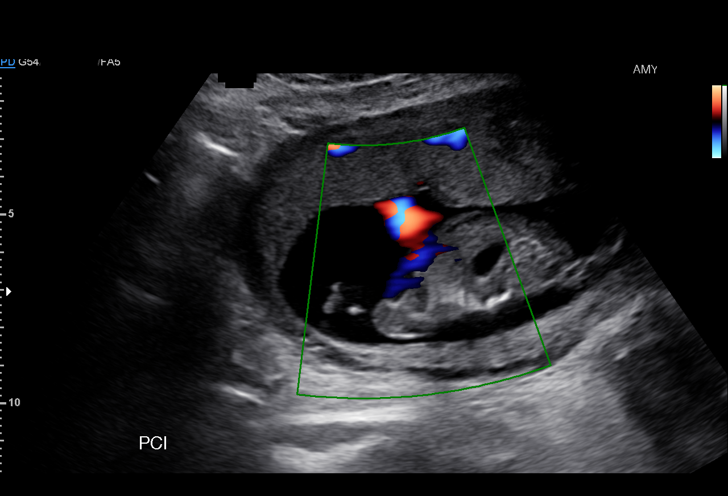
[im 11/56]
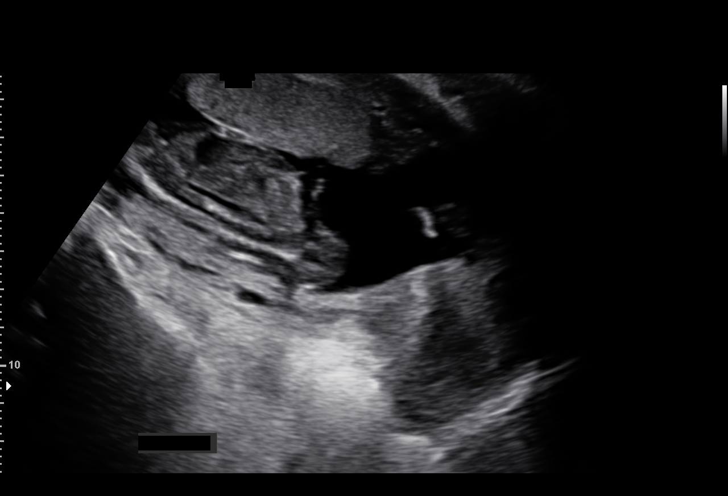
[im 15/56]
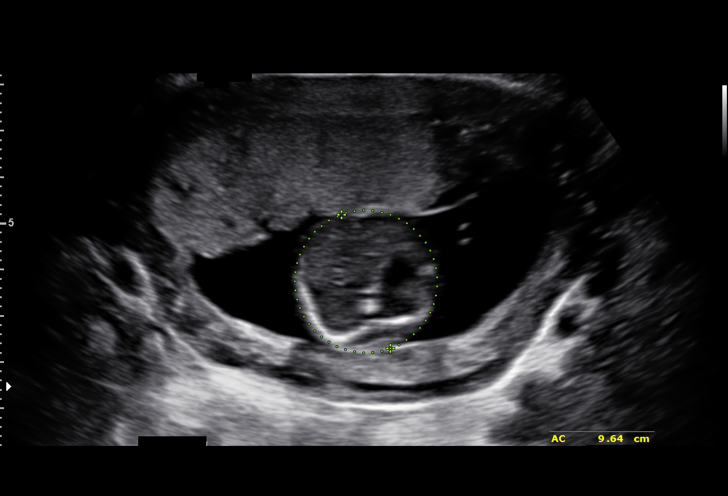
[im 19/56]
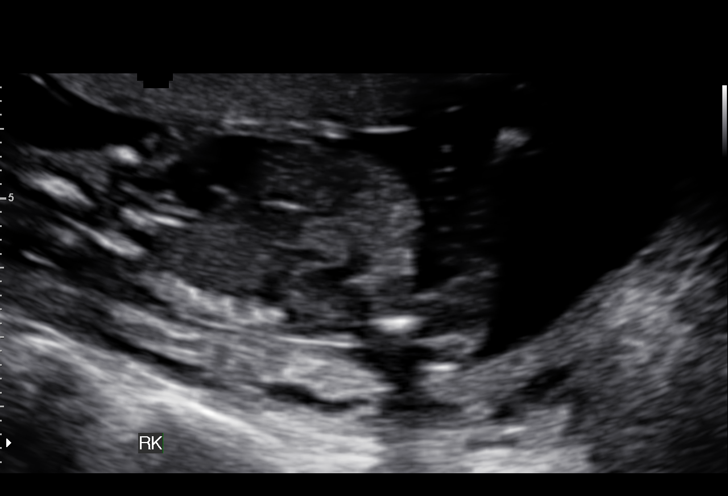
[im 23/56]
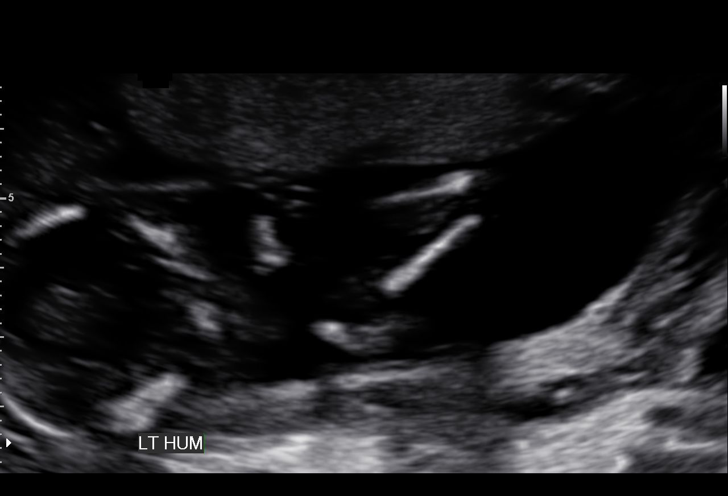
[im 27/56]
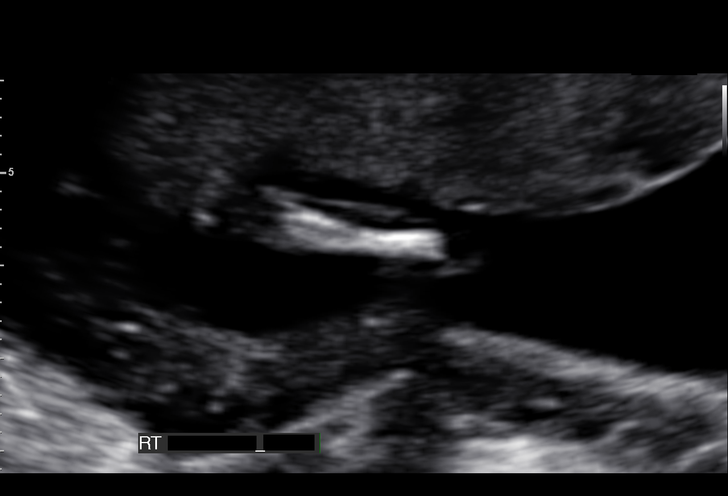
[im 31/56]
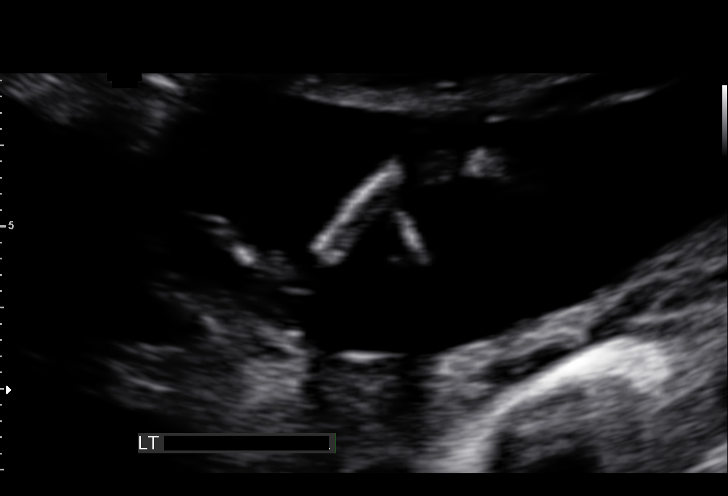
[im 35/56]
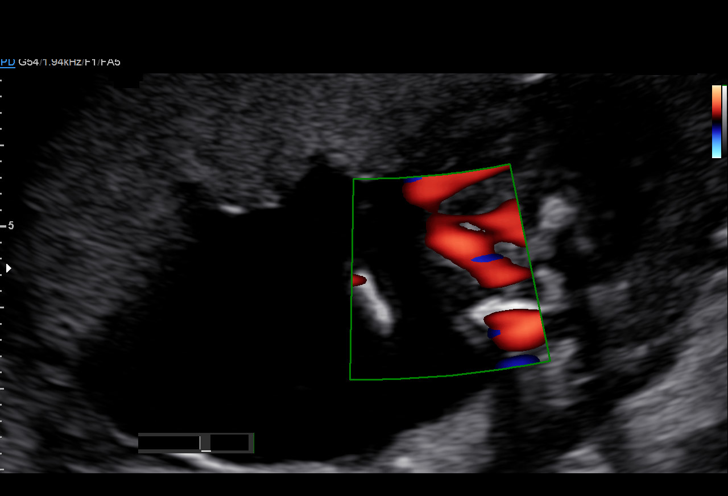
[im 39/56]
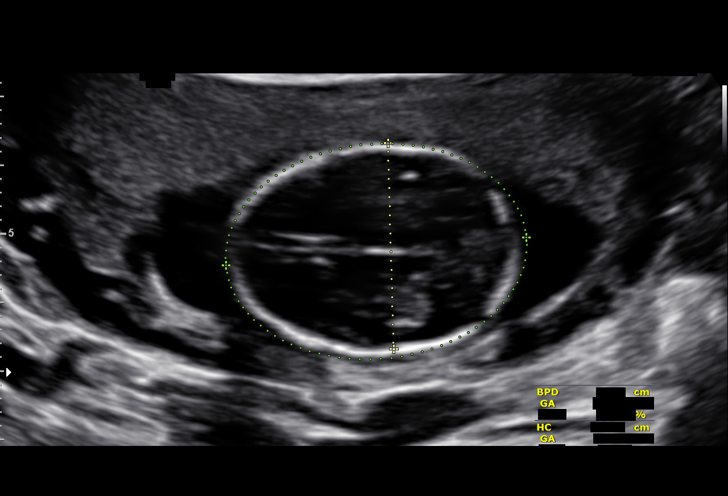
[im 43/56]
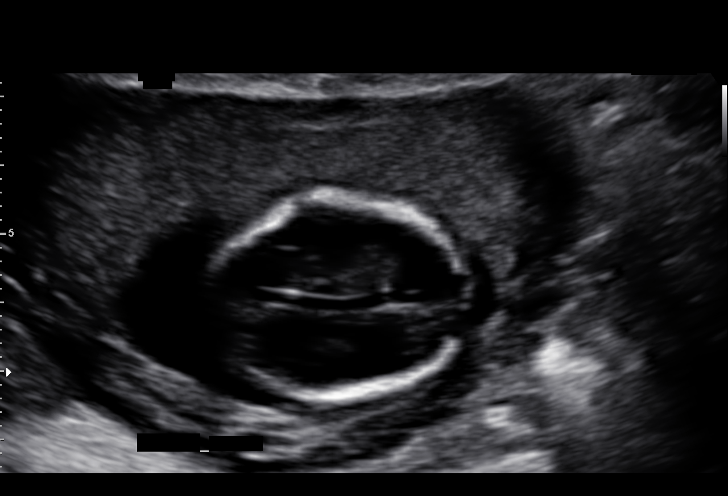
[im 47/56]
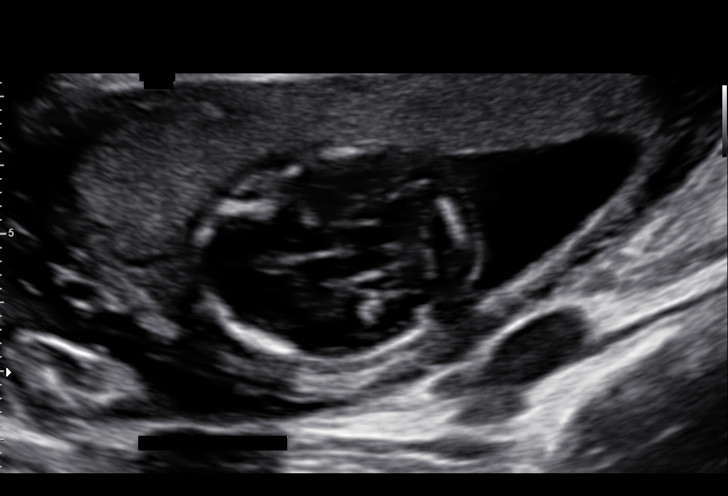
[im 51/56]
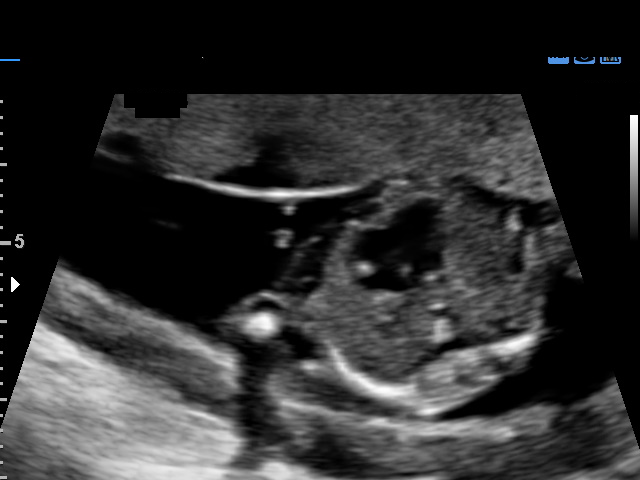
[im 56/56]
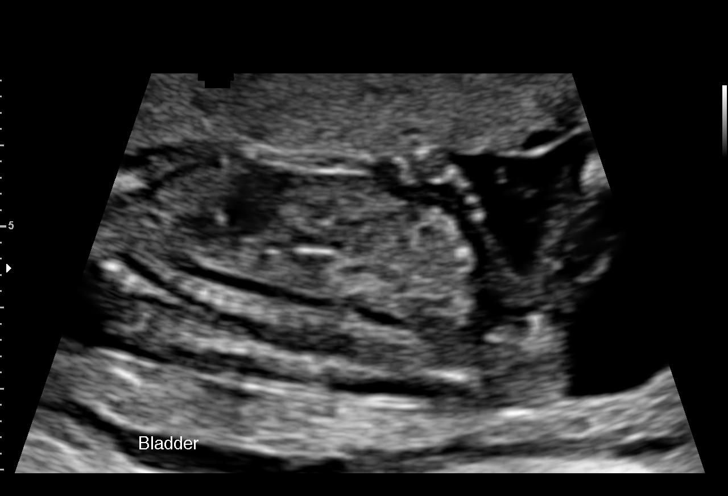

[14 of 28 positions shown; findings below may reference images not displayed]

OBSTETRICS REPORT
(Signed Final 09/24/2015 [DATE])

Name:       SHOBANA PEPPER                   Visit  09/24/2015 [DATE]
Date:

Service(s) Provided

Indications

Uncertain LMP,  Establish Gestational Age              Z36
Basic anatomic survey                                  Z36
15 weeks gestation of pregnancy
Fetal Evaluation

Num Of             1
Fetuses:
Fetal Heart        150                          bpm
Rate:
Cardiac Activity:  Observed
Presentation:      Breech
Placenta:          Posterior, above cervical
os
P. Cord            Visualized
Insertion:

Amniotic Fluid
AFI FV:      Subjectively within normal limits
Larg Pckt:      3.4  cm
Biometry

BPD:     30.1   m    G. Age:   15w 4d                 CI:        64.88   70 - 86
m
FL/HC:      17.0   13.3 -
16.5
HC:     120.1   m    G. Age:   16w 0d        41  %    HC/AC:      1.24   1.05 -
m
AC:      96.6   m    G. Age:   15w 5d        48  %    FL/BPD
m                                     :
FL:      20.4   m    G. Age:   16w 1d        54  %    FL/AC:      21.1   20 - 24
m
HUM:       18   m    G. Age:   15w 1d        32  %
m

Est.         139   gm    0 lb 5 oz      71   %
FW:
Gestational Age

U/S Today:     15w 6d                                         EDD:   03/11/16
Best:          15w 6d    Det. By:   U/S (09/24/15)            EDD:   03/11/16
Anatomy

Cranium:          Appears normal         Stomach:           Appears normal,
left sided
Fetal Cavum:      Appears normal         Abdomen:           Appears normal
Choroid Plexus:   Appears normal         Abdominal          Appears nml (cord
Wall:              insert, abd wall)
Cerebellum:       Appears normal         Cord Vessels:      Appears normal (3
vessel cord)
Posterior         Appears normal         Kidneys:           Appear normal
Fossa:
Nuchal Fold:      Appears normal         Bladder:           Appears normal
RVOT:             Appears normal         Lower              Appears normal
Extremities:
LVOT:             Appears normal         Upper              Appears normal
Extremities:
Diaphragm:        Appears normal

Other:   Fetus appears to be a female. Heels appears normal.
Cervix Uterus Adnexa

Cervical Length:    3.57      cm

Cervix:       Normal appearance by transabdominal scan.
Appears closed, without funnelling.
Impression

Single living intrauterine pregnancy at 15 weeks 6 day.
Biometry is not consistent with EDC based on LMP.
Pregnancy should be dated by today's ultrasound.
Appropriate fetal growth (71%).
Normal amniotic fluid volume.
The fetal anatomic survey is not complete.
No gross fetal anomalies identified.
Recommendations

Recommend follow-up ultrasound examination in 4 weeks
to reassess fetal growth and anatomy.

ANSELIHO KRISTE with us.  Please do not hesitate to

## 2018-04-06 ENCOUNTER — Other Ambulatory Visit (HOSPITAL_COMMUNITY): Payer: Self-pay | Admitting: *Deleted

## 2018-04-06 DIAGNOSIS — N6452 Nipple discharge: Secondary | ICD-10-CM

## 2018-05-03 ENCOUNTER — Ambulatory Visit
Admission: RE | Admit: 2018-05-03 | Discharge: 2018-05-03 | Disposition: A | Discharge status: Home / Self Care | Payer: No Typology Code available for payment source | Source: Ambulatory Visit | Attending: Obstetrics and Gynecology | Admitting: Obstetrics and Gynecology

## 2018-05-03 ENCOUNTER — Encounter (HOSPITAL_COMMUNITY): Payer: Self-pay | Admitting: *Deleted

## 2018-05-03 ENCOUNTER — Ambulatory Visit (HOSPITAL_COMMUNITY)
Admission: RE | Admit: 2018-05-03 | Discharge: 2018-05-03 | Disposition: A | Discharge status: Home / Self Care | Payer: Self-pay | Source: Ambulatory Visit | Attending: Obstetrics and Gynecology | Admitting: Obstetrics and Gynecology

## 2018-05-03 VITALS — BP 108/62 | Ht 61.0 in

## 2018-05-03 DIAGNOSIS — N6452 Nipple discharge: Secondary | ICD-10-CM

## 2018-05-03 DIAGNOSIS — Z1239 Encounter for other screening for malignant neoplasm of breast: Secondary | ICD-10-CM

## 2018-05-03 DIAGNOSIS — N644 Mastodynia: Secondary | ICD-10-CM

## 2018-05-03 NOTE — Patient Instructions (Signed)
Explained breast self awareness with Harrie Jeans. Patient did not need a Pap smear today due to last Pap smear was 08/27/2015. Let her know BCCCP will cover Pap smears every 3 years unless has a history of abnormal Pap smears. Reminded patient that her next Pap smear is due in September and she can call to schedule with [facility]. Referred patient to the [facility] of [facility] for a bilateral breast ultrasound. Appointment scheduled for Thursday, May 03, 2018 at 1530. Baldo Daub Rios verbalized understanding.  Brannock, Kathaleen Maser, RN 4:31 PM

## 2018-05-03 NOTE — Progress Notes (Addendum)
Complaints of bilateral nipple discharge when expresses x 2 months that is a yellowish to white color. Patient complained of bilateral breast pain x 1 week when touched. Patient rates the pain at a 1 out of 10.  Pap Smear: Pap smear not completed today. Last Pap smear was 08/27/2015 at the [facility] and normal. Per patient has no history of an abnormal Pap smear. Last Pap smear result is in Epic.  Physical exam: Breasts Breasts symmetrical. No skin abnormalities bilateral breasts. No nipple retraction bilateral breasts. Whitish/yellowish colored discharge expressed from left breast on exam. A whitish colored discharge expressed from the right breast on exam. Sample of bilateral breast discharge sent to cytology for evaluation. No lymphadenopathy. No lumps palpated bilateral breasts. Complaints of right center breast pain on exam. Referred patient to the [facility] for a bilateral breast ultrasound. Appointment scheduled for Thursday, May 03, 2018 at 1530.        Pelvic/Bimanual No Pap smear completed today since last Pap smear and HPV typing was 08/27/2015. Pap smear not indicated per BCCCP guidelines.   Smoking History: Patient has never smoked.  Patient Navigation: Patient education provided. Access to services provided for patient through [facility] program. Spanish interpreter provided.   Breast and Cervical Cancer Risk Assessment: Patient has no family history of breast cancer, known genetic mutations, or radiation treatment to the chest before age 41. Patient has no history of cervical dysplasia, immunocompromised, or DES exposure in-utero. Completed Breast Cancer Risk Assessment. Due to patient being under the age of 33 no risk calculated.   Used Spanish interpreter [employer] from [place].

## 2018-05-28 ENCOUNTER — Encounter (HOSPITAL_COMMUNITY): Payer: Self-pay | Admitting: *Deleted

## 2018-12-12 NOTE — L&D Delivery Note (Signed)
Delivery Note At 5:49 PM a viable and healthy female was delivered via Vaginal, Spontaneous (Presentation: Direct OA ).  APGAR: 9, 9 ; weight  pending.   Placenta status:spontaneous, intact .  Cord: 3 vessel with the following complications: none.  Anesthesia:  None Episiotomy: None Lacerations: None Suture Repair: None intact Est. Blood Loss (mL): 389  Mom to postpartum.  Baby to Couplet care / Skin to Skin.  Michele Stephens is a 28 y.o. female G2P1001 with IUP at [redacted]w[redacted]d admitted for active labor.  She progressed without augmentation to complete and pushed ~ 30 minutes to deliver.  Cord clamping delayed by 1 minute then clamped by CNM and cut by FOB.  Placenta intact and spontaneous, bleeding minimal.  Intact perineum.  Mom and baby stable prior to transfer to postpartum. She plans on breastfeeding.   Fatima Blank 09/01/2019, 6:11 PM

## 2019-01-24 ENCOUNTER — Other Ambulatory Visit: Payer: Self-pay

## 2019-01-24 ENCOUNTER — Encounter (HOSPITAL_COMMUNITY): Payer: Self-pay | Admitting: *Deleted

## 2019-01-24 ENCOUNTER — Inpatient Hospital Stay (HOSPITAL_COMMUNITY)
Admission: AD | Admit: 2019-01-24 | Discharge: 2019-01-24 | Disposition: A | Discharge status: Home / Self Care | Payer: Self-pay | Attending: Obstetrics & Gynecology | Admitting: Obstetrics & Gynecology

## 2019-01-24 ENCOUNTER — Inpatient Hospital Stay (HOSPITAL_COMMUNITY): Payer: Self-pay

## 2019-01-24 DIAGNOSIS — Z3491 Encounter for supervision of normal pregnancy, unspecified, first trimester: Secondary | ICD-10-CM

## 2019-01-24 DIAGNOSIS — O208 Other hemorrhage in early pregnancy: Secondary | ICD-10-CM | POA: Insufficient documentation

## 2019-01-24 DIAGNOSIS — Z3A08 8 weeks gestation of pregnancy: Secondary | ICD-10-CM | POA: Insufficient documentation

## 2019-01-24 DIAGNOSIS — O418X10 Other specified disorders of amniotic fluid and membranes, first trimester, not applicable or unspecified: Secondary | ICD-10-CM

## 2019-01-24 DIAGNOSIS — O209 Hemorrhage in early pregnancy, unspecified: Secondary | ICD-10-CM

## 2019-01-24 DIAGNOSIS — O468X1 Other antepartum hemorrhage, first trimester: Secondary | ICD-10-CM

## 2019-01-24 HISTORY — DX: Gestational diabetes mellitus in pregnancy, unspecified control: O24.419

## 2019-01-24 LAB — URINALYSIS, MICROSCOPIC (REFLEX)

## 2019-01-24 LAB — URINALYSIS, ROUTINE W REFLEX MICROSCOPIC
Bilirubin Urine: NEGATIVE
GLUCOSE, UA: NEGATIVE mg/dL
KETONES UR: NEGATIVE mg/dL
Leukocytes,Ua: NEGATIVE
NITRITE: NEGATIVE
PH: 7 (ref 5.0–8.0)
Protein, ur: NEGATIVE mg/dL
SPECIFIC GRAVITY, URINE: 1.01 (ref 1.005–1.030)

## 2019-01-24 LAB — CBC
HEMATOCRIT: 36.4 % (ref 36.0–46.0)
HEMOGLOBIN: 12.6 g/dL (ref 12.0–15.0)
MCH: 31.7 pg (ref 26.0–34.0)
MCHC: 34.6 g/dL (ref 30.0–36.0)
MCV: 91.7 fL (ref 80.0–100.0)
Platelets: 250 10*3/uL (ref 150–400)
RBC: 3.97 MIL/uL (ref 3.87–5.11)
RDW: 12.3 % (ref 11.5–15.5)
WBC: 8.3 10*3/uL (ref 4.0–10.5)
nRBC: 0 % (ref 0.0–0.2)

## 2019-01-24 LAB — WET PREP, GENITAL
CLUE CELLS WET PREP: NONE SEEN
SPERM: NONE SEEN
Trich, Wet Prep: NONE SEEN
Yeast Wet Prep HPF POC: NONE SEEN

## 2019-01-24 LAB — HCG, QUANTITATIVE, PREGNANCY: hCG, Beta Chain, Quant, S: 187269 m[IU]/mL — ABNORMAL HIGH (ref <5)

## 2019-01-24 LAB — POCT PREGNANCY, URINE: PREG TEST UR: POSITIVE — AB

## 2019-01-24 NOTE — Discharge Instructions (Signed)
Hematoma subcoriónico °Subchorionic Hematoma ° °Un hematoma subcoriónico es una acumulación de sangre entre la pared externa del embrión (corion) y la pared interna de la matriz (útero). °Esta afección puede causar hemorragia vaginal. Si causan poca o nada de hemorragia vaginal, generalmente, los hematomas pequeños que ocurren al principio del embarazo se reducen por su propia cuenta y no afectan al bebé ni al embarazo. Cuando la hemorragia comienza más tarde en el embarazo, o el hematoma es más grande o se produce en una paciente de edad avanzada, la afección puede ser más grave. Los hematomas más grandes pueden agrandarse aún más, lo que aumenta las posibilidades de aborto espontáneo. Esta afección también aumenta los siguientes riesgos: °· Separación prematura de la placenta del útero. °· Parto antes de término (prematuro). °· Muerte fetal. °¿Cuáles son las causas? °Se desconoce la causa exacta de esta afección. Ocurre cuando la sangre queda atrapada entre la placenta y la pared uterina porque la placenta se ha separado del lugar original del implante. °¿Qué incrementa el riesgo? °Es más probable que desarrolle esta afección si: °· Recibió tratamiento con medicamentos para la fertilidad. °· La concepción se realizó a través de la fertilización in vitro (FIV). °¿Cuáles son los signos o los síntomas? °Los síntomas de esta afección incluyen los siguientes: °· Pérdida o hemorragia vaginal. °· Contracciones del útero. Estas contracciones provocan dolor abdominal. °En ocasiones, puede no haber síntomas y la hemorragia solo se puede ver cuando se toman imágenes ecográficas (ecografía transvaginal). °¿Cómo se diagnostica? °Esta afección se diagnostica con un examen físico. Es un examen pélvico. También pueden hacerle otros estudios, por ejemplo: °· Análisis de sangre. °· Análisis de orina. °· Ecografía del abdomen. °¿Cómo se trata? °El tratamiento de esta afección puede variar. El tratamiento puede incluir lo  siguiente: °· Observación cautelosa. La observarán atentamente para detectar cualquier cambio en la hemorragia. Durante esta etapa: °? El hematoma puede reabsorberse en el cuerpo. °? El hematoma puede separar el espacio lleno de líquido que contiene al embrión (saco gestacional) de la pared del útero (endometrio). °· Medicamentos. °· Restricción de las actividades. Puede ser necesaria hasta que se detenga la hemorragia. °Siga estas indicaciones en su casa: °· Haga reposo en cama si se lo indica el médico. °· No levante ningún objeto que pese más de 10 libras (4,5 kg) o siga las indicaciones del médico. °· No consuma ningún producto que contenga nicotina o tabaco, como cigarrillos y cigarrillos electrónicos. Si necesita ayuda para dejar de fumar, consulte al médico. °· Lleve un registro escrito de la cantidad de toallas higiénicas que utiliza cada día y cuán empapadas (saturadas) están. °· No use tampones. °· Concurra a todas las visitas de control como se lo haya indicado el médico. Esto es importante. El profesional podrá pedirle que se realice análisis de seguimiento, ecografías o ambas. °Comuníquese con un médico si: °· Tiene una hemorragia vaginal. °· Tiene fiebre. °Solicite ayuda de inmediato si: °· Siente calambres intensos en el estómago, en la espalda, en el abdomen o en la pelvis. °· Elimina coágulos o tejidos grandes. Guarde los tejidos para que su médico los vea. °· Tiene más hemorragia vaginal, y se desmaya o se siente mareada o débil. °Resumen °· Un hematoma subcoriónico es una acumulación de sangre entre la pared externa de la placenta y el útero. °· Esta afección puede causar hemorragia vaginal. °· En ocasiones, puede no haber síntomas y la hemorragia solo se puede ver cuando se toman imágenes ecográficas. °· El tratamiento puede incluir una observación cautelosa, medicamentos   o restricción de las actividades. °Esta información no tiene como fin reemplazar el consejo del médico. Asegúrese de hacerle  al médico cualquier pregunta que tenga. °Document Released: 03/16/2009 Document Revised: 09/07/2017 Document Reviewed: 09/07/2017 °Elsevier Interactive Patient Education © 2019 Elsevier Inc. ° °

## 2019-01-24 NOTE — MAU Provider Note (Signed)
Chief Complaint: Vaginal Bleeding and Vaginal Discharge   First Provider Initiated Contact with Patient 01/24/19 1418     *Spanish interpreter used for this visit*  SUBJECTIVE HPI: Michele Stephens is a 28 y.o. G2P1001 at [redacted]w[redacted]d who presents to Maternity Admissions reporting vaginal bleeding. Reports red spotting for the last few days, which she has also noticed on a panty liner. Is not saturating pads or passing clots. Feels some "heaviness" in her left lower quadrant intermittently that is worse when she's lying on her left side. Denies abdominal pain. Last intercourse was on Saturday.    Past Medical History:  Diagnosis Date  . Diabetes mellitus without complication (HCC)   . Gestational diabetes    OB History  Gravida Para Term Preterm AB Living  2 1 1     1   SAB TAB Ectopic Multiple Live Births        0 1    # Outcome Date GA Lbr Len/2nd Weight Sex Delivery Anes PTL Lv  2 Current           1 Term 03/14/16 [redacted]w[redacted]d 28:14 / 02:20 3435 g F Vag-Spont EPI, Local  LIV   Past Surgical History:  Procedure Laterality Date  . NO PAST SURGERIES     Social History   Socioeconomic History  . Marital status: Single    Spouse name: Not on file  . Number of children: Not on file  . Years of education: Not on file  . Highest education level: Not on file  Occupational History  . Not on file  Social Needs  . Financial resource strain: Not on file  . Food insecurity:    Worry: Not on file    Inability: Not on file  . Transportation needs:    Medical: Not on file    Non-medical: Not on file  Tobacco Use  . Smoking status: Never Smoker  . Smokeless tobacco: Never Used  Substance and Sexual Activity  . Alcohol use: No  . Drug use: No  . Sexual activity: Yes    Birth control/protection: None  Lifestyle  . Physical activity:    Days per week: Not on file    Minutes per session: Not on file  . Stress: Not on file  Relationships  . Social connections:    Talks on phone: Not on  file    Gets together: Not on file    Attends religious service: Not on file    Active member of club or organization: Not on file    Attends meetings of clubs or organizations: Not on file    Relationship status: Not on file  . Intimate partner violence:    Fear of current or ex partner: Not on file    Emotionally abused: Not on file    Physically abused: Not on file    Forced sexual activity: Not on file  Other Topics Concern  . Not on file  Social History Narrative  . Not on file   No family history on file. No current facility-administered medications on file prior to encounter.    No current outpatient medications on file prior to encounter.   No Known Allergies  I have reviewed patient's Past Medical Hx, Surgical Hx, Family Hx, Social Hx, medications and allergies.   Review of Systems  Constitutional: Negative.   Gastrointestinal: Negative.   Genitourinary: Positive for vaginal bleeding.    OBJECTIVE Patient Vitals for the past 24 hrs:  BP Temp Temp src Pulse Resp SpO2  01/24/19 1705 (!) 102/57 98.2 F (36.8 C) Oral 78 18 100 %  01/24/19 1342 (!) 105/55 (!) 97.4 F (36.3 C) Oral 75 16 -   Constitutional: Well-developed, well-nourished female in no acute distress.  Cardiovascular: normal rate & rhythm, no murmur Respiratory: normal rate and effort. Lung sounds clear throughout GI: Abd soft, non-tender, Pos BS x 4. No guarding or rebound tenderness MS: Extremities nontender, no edema, normal ROM Neurologic: Alert and oriented x 4.  GU:     SPECULUM EXAM: NEFG, small amount of mucoid discharge with red streaks. No active bleeding.   BIMANUAL: No CMT. cervix closed; uterus normal size, no adnexal tenderness or masses.    LAB RESULTS Results for orders placed or performed during the hospital encounter of 01/24/19 (from the past 24 hour(s))  Urinalysis, Routine w reflex microscopic     Status: Abnormal   Collection Time: 01/24/19  1:37 PM  Result Value Ref Range    Color, Urine YELLOW YELLOW   APPearance CLEAR CLEAR   Specific Gravity, Urine 1.010 1.005 - 1.030   pH 7.0 5.0 - 8.0   Glucose, UA NEGATIVE NEGATIVE mg/dL   Hgb urine dipstick SMALL (A) NEGATIVE   Bilirubin Urine NEGATIVE NEGATIVE   Ketones, ur NEGATIVE NEGATIVE mg/dL   Protein, ur NEGATIVE NEGATIVE mg/dL   Nitrite NEGATIVE NEGATIVE   Leukocytes,Ua NEGATIVE NEGATIVE  Urinalysis, Microscopic (reflex)     Status: Abnormal   Collection Time: 01/24/19  1:37 PM  Result Value Ref Range   RBC / HPF 0-5 0 - 5 RBC/hpf   WBC, UA 0-5 0 - 5 WBC/hpf   Bacteria, UA RARE (A) NONE SEEN   Squamous Epithelial / LPF 6-10 0 - 5   Mucus PRESENT   Pregnancy, urine POC     Status: Abnormal   Collection Time: 01/24/19  1:52 PM  Result Value Ref Range   Preg Test, Ur POSITIVE (A) NEGATIVE  Wet prep, genital     Status: Abnormal   Collection Time: 01/24/19  2:33 PM  Result Value Ref Range   Yeast Wet Prep HPF POC NONE SEEN NONE SEEN   Trich, Wet Prep NONE SEEN NONE SEEN   Clue Cells Wet Prep HPF POC NONE SEEN NONE SEEN   WBC, Wet Prep HPF POC MANY (A) NONE SEEN   Sperm NONE SEEN   CBC     Status: None   Collection Time: 01/24/19  2:41 PM  Result Value Ref Range   WBC 8.3 4.0 - 10.5 K/uL   RBC 3.97 3.87 - 5.11 MIL/uL   Hemoglobin 12.6 12.0 - 15.0 g/dL   HCT 53.636.4 64.436.0 - 03.446.0 %   MCV 91.7 80.0 - 100.0 fL   MCH 31.7 26.0 - 34.0 pg   MCHC 34.6 30.0 - 36.0 g/dL   RDW 74.212.3 59.511.5 - 63.815.5 %   Platelets 250 150 - 400 K/uL   nRBC 0.0 0.0 - 0.2 %  hCG, quantitative, pregnancy     Status: Abnormal   Collection Time: 01/24/19  2:41 PM  Result Value Ref Range   hCG, Beta Chain, Quant, S 187,269 (H) <5 mIU/mL    IMAGING No results found.  MAU COURSE Orders Placed This Encounter  Procedures  . Wet prep, genital  . US OB Comp Less 14 Wks  . Urinalysis, Routine w reflex microscopic  . Urinalysis, Microscopic (reflex)  . CBC  . hCG, quantitative, pregnancy  . Pregnancy, urine POC  . Discharge patient    No  orders of the defined types were placed in this encounter.   MDM +UPT UA, wet prep, GC/chlamydia, CBC, ABO/Rh, quant hCG, and [place] today to rule out ectopic pregnancy RH positive  Minimal bleeding on exam & cervix closed.  Ultrasound shows live IUP with small SCH. EDD updated per ultrasound.   ASSESSMENT 1. Normal IUP (intrauterine pregnancy) on prenatal ultrasound, first trimester   2. Vaginal bleeding in pregnancy, first trimester   3. Subchorionic hematoma in first trimester, single or unspecified fetus     PLAN Discharge home in stable condition. Bleeding precautions Follow-up Information    Department, [facility] Follow up.   Contact information: [address] [place] [address] [phone]          Allergies as of 01/24/2019   No Known Allergies     Medication List    You have not been prescribed any medications.      Judeth Horn, NP 01/24/2019  5:12 PM

## 2019-01-24 NOTE — MAU Note (Signed)
Pt has noticed blood with wiping since Tuesday, also mucus.  Denies pain.  Had positive UPT @ [facility].  C/O nausea & occasional vomiting.

## 2019-01-25 LAB — GC/CHLAMYDIA PROBE AMP (~~LOC~~) NOT AT ARMC
Chlamydia: NEGATIVE
Neisseria Gonorrhea: NEGATIVE

## 2019-03-01 LAB — OB RESULTS CONSOLE ABO/RH: RH Type: POSITIVE

## 2019-03-01 LAB — OB RESULTS CONSOLE RUBELLA ANTIBODY, IGM: Rubella: IMMUNE

## 2019-03-01 LAB — OB RESULTS CONSOLE HEPATITIS B SURFACE ANTIGEN: Hepatitis B Surface Ag: NEGATIVE

## 2019-07-09 LAB — OB RESULTS CONSOLE RPR: RPR: NONREACTIVE

## 2019-07-09 LAB — OB RESULTS CONSOLE VARICELLA ZOSTER ANTIBODY, IGG: Varicella: IMMUNE

## 2019-07-09 LAB — OB RESULTS CONSOLE HIV ANTIBODY (ROUTINE TESTING): HIV: NONREACTIVE

## 2019-08-09 LAB — OB RESULTS CONSOLE GBS: GBS: NEGATIVE

## 2019-09-01 ENCOUNTER — Other Ambulatory Visit: Payer: Self-pay

## 2019-09-01 ENCOUNTER — Inpatient Hospital Stay (HOSPITAL_COMMUNITY)
Admission: AD | Admit: 2019-09-01 | Discharge: 2019-09-03 | DRG: 807 | Disposition: A | Discharge status: Home / Self Care | Payer: Medicaid Other | Attending: Obstetrics and Gynecology | Admitting: Obstetrics and Gynecology

## 2019-09-01 ENCOUNTER — Encounter (HOSPITAL_COMMUNITY): Payer: Self-pay

## 2019-09-01 DIAGNOSIS — Z3A39 39 weeks gestation of pregnancy: Secondary | ICD-10-CM | POA: Diagnosis not present

## 2019-09-01 DIAGNOSIS — O26893 Other specified pregnancy related conditions, third trimester: Secondary | ICD-10-CM | POA: Diagnosis present

## 2019-09-01 DIAGNOSIS — Z20828 Contact with and (suspected) exposure to other viral communicable diseases: Secondary | ICD-10-CM | POA: Diagnosis present

## 2019-09-01 LAB — CBC
HCT: 42.2 % (ref 36.0–46.0)
Hemoglobin: 14.6 g/dL (ref 12.0–15.0)
MCH: 32.5 pg (ref 26.0–34.0)
MCHC: 34.6 g/dL (ref 30.0–36.0)
MCV: 94 fL (ref 80.0–100.0)
Platelets: 207 10*3/uL (ref 150–400)
RBC: 4.49 MIL/uL (ref 3.87–5.11)
RDW: 13.4 % (ref 11.5–15.5)
WBC: 8.6 10*3/uL (ref 4.0–10.5)
nRBC: 0 % (ref 0.0–0.2)

## 2019-09-01 LAB — TYPE AND SCREEN
ABO/RH(D): O POS
Antibody Screen: NEGATIVE

## 2019-09-01 LAB — SARS CORONAVIRUS 2 BY RT PCR (HOSPITAL ORDER, PERFORMED IN ~~LOC~~ HOSPITAL LAB): SARS Coronavirus 2: NEGATIVE

## 2019-09-01 LAB — RPR: RPR Ser Ql: NONREACTIVE

## 2019-09-01 LAB — ABO/RH: ABO/RH(D): O POS

## 2019-09-01 MED ORDER — ONDANSETRON HCL 4 MG/2ML IJ SOLN: 4.0000 mg | INTRAMUSCULAR | Status: DC | PRN

## 2019-09-01 MED ORDER — OXYCODONE-ACETAMINOPHEN 5-325 MG PO TABS: 1.0000 | ORAL_TABLET | ORAL | Status: DC | PRN

## 2019-09-01 MED ORDER — SOD CITRATE-CITRIC ACID 500-334 MG/5ML PO SOLN: 30.0000 mL | ORAL | Status: DC | PRN

## 2019-09-01 MED ORDER — ACETAMINOPHEN 325 MG PO TABS
650.0000 mg | ORAL_TABLET | ORAL | Status: DC | PRN
  Administered 2019-09-01: 18:00:00 650 mg via ORAL
  Filled 2019-09-01: qty 2

## 2019-09-01 MED ORDER — TETANUS-DIPHTH-ACELL PERTUSSIS 5-2.5-18.5 LF-MCG/0.5 IM SUSP: 0.5000 mL | Freq: Once | INTRAMUSCULAR | Status: DC

## 2019-09-01 MED ORDER — DIPHENHYDRAMINE HCL 25 MG PO CAPS: 25.0000 mg | ORAL_CAPSULE | Freq: Four times a day (QID) | ORAL | Status: DC | PRN

## 2019-09-01 MED ORDER — COCONUT OIL OIL: 1.0000 "application " | TOPICAL_OIL | Status: DC | PRN

## 2019-09-01 MED ORDER — ONDANSETRON HCL 4 MG/2ML IJ SOLN: 4.0000 mg | Freq: Four times a day (QID) | INTRAMUSCULAR | Status: DC | PRN

## 2019-09-01 MED ORDER — ACETAMINOPHEN 325 MG PO TABS: 650.0000 mg | ORAL_TABLET | ORAL | Status: DC | PRN

## 2019-09-01 MED ORDER — OXYCODONE-ACETAMINOPHEN 5-325 MG PO TABS: 2.0000 | ORAL_TABLET | ORAL | Status: DC | PRN

## 2019-09-01 MED ORDER — ZOLPIDEM TARTRATE 5 MG PO TABS: 5.0000 mg | ORAL_TABLET | Freq: Every evening | ORAL | Status: DC | PRN

## 2019-09-01 MED ORDER — IBUPROFEN 600 MG PO TABS
600.0000 mg | ORAL_TABLET | Freq: Four times a day (QID) | ORAL | Status: DC
  Administered 2019-09-02 – 2019-09-03 (×6): 600 mg via ORAL
  Filled 2019-09-01 (×6): qty 1

## 2019-09-01 MED ORDER — LACTATED RINGERS IV SOLN
INTRAVENOUS | Status: DC
  Administered 2019-09-01: 16:00:00 via INTRAVENOUS
  Administered 2019-09-01: 125 mL/h via INTRAVENOUS

## 2019-09-01 MED ORDER — OXYTOCIN BOLUS FROM INFUSION
500.0000 mL | Freq: Once | INTRAVENOUS | Status: AC
  Administered 2019-09-01: 18:00:00 500 mL via INTRAVENOUS

## 2019-09-01 MED ORDER — DIBUCAINE (PERIANAL) 1 % EX OINT: 1.0000 "application " | TOPICAL_OINTMENT | CUTANEOUS | Status: DC | PRN

## 2019-09-01 MED ORDER — FLEET ENEMA 7-19 GM/118ML RE ENEM: 1.0000 | ENEMA | RECTAL | Status: DC | PRN

## 2019-09-01 MED ORDER — SENNOSIDES-DOCUSATE SODIUM 8.6-50 MG PO TABS
2.0000 | ORAL_TABLET | ORAL | Status: DC
  Administered 2019-09-02 (×2): 2 via ORAL
  Filled 2019-09-01 (×2): qty 2

## 2019-09-01 MED ORDER — OXYTOCIN 40 UNITS IN NORMAL SALINE INFUSION - SIMPLE MED
INTRAVENOUS | Status: AC
  Filled 2019-09-01: qty 1000

## 2019-09-01 MED ORDER — OXYTOCIN 40 UNITS IN NORMAL SALINE INFUSION - SIMPLE MED
2.5000 [IU]/h | INTRAVENOUS | Status: DC
  Administered 2019-09-01: 2.5 [IU]/h via INTRAVENOUS

## 2019-09-01 MED ORDER — WITCH HAZEL-GLYCERIN EX PADS: 1.0000 "application " | MEDICATED_PAD | CUTANEOUS | Status: DC | PRN

## 2019-09-01 MED ORDER — LIDOCAINE HCL (PF) 1 % IJ SOLN: 30.0000 mL | INTRAMUSCULAR | Status: DC | PRN

## 2019-09-01 MED ORDER — ONDANSETRON HCL 4 MG PO TABS: 4.0000 mg | ORAL_TABLET | ORAL | Status: DC | PRN

## 2019-09-01 MED ORDER — BENZOCAINE-MENTHOL 20-0.5 % EX AERO: 1.0000 "application " | INHALATION_SPRAY | CUTANEOUS | Status: DC | PRN

## 2019-09-01 MED ORDER — LACTATED RINGERS IV SOLN: 500.0000 mL | INTRAVENOUS | Status: DC | PRN

## 2019-09-01 MED ORDER — SIMETHICONE 80 MG PO CHEW: 80.0000 mg | CHEWABLE_TABLET | ORAL | Status: DC | PRN

## 2019-09-01 MED ORDER — PRENATAL MULTIVITAMIN CH
1.0000 | ORAL_TABLET | Freq: Every day | ORAL | Status: DC
  Administered 2019-09-02 – 2019-09-03 (×2): 1 via ORAL
  Filled 2019-09-01 (×2): qty 1

## 2019-09-01 NOTE — Discharge Summary (Addendum)
Postpartum Discharge Summary       Patient Name: Michele Stephens DOB: 04-13-1991 MRN: [id number]  Date of admission: 09/01/2019 Delivering Provider: Fatima Blank A   Date of discharge: 09/03/2019  Admitting diagnosis: 39 weeks CXT less than 25mn Intrauterine pregnancy: 355w2d   Secondary diagnosis:  Active Problems:   Indication for care in labor and delivery, antepartum   NSVD (normal spontaneous vaginal delivery)  Additional problems:      Discharge diagnosis: Term Pregnancy Delivered                                                                                                Post partum procedures:none  Augmentation: AROM  Complications: None  Hospital course:  Onset of Labor With Vaginal Delivery     [age].o. yo G2P2002 at 3920w2ds admitted in Active Labor on 09/01/2019. Patient had an uncomplicated labor course as follows:  Membrane Rupture Time/Date: 2:41 PM ,09/01/2019   Intrapartum Procedures: Episiotomy: None [1]                                         Lacerations:  None [1]  Patient had a delivery of a Viable infant. 09/01/2019  Information for the patient's newborn:  VasAroush Chasseirl [id number]]elivery Method: Vaginal, Spontaneous(Filed from Delivery Summary)     Pateint had an uncomplicated postpartum course.  She is ambulating, tolerating a regular diet, passing flatus, and urinating well. Patient is discharged home in stable condition on 09/03/19. Declines birth control. Will set up f/u appt at HD.   Delivery time: 5:49 PM    Magnesium Sulfate received: No BMZ received: No Rhophylac:No MMR:No Transfusion:No  Physical exam  Vitals:   09/02/19 0536 09/02/19 1512 09/02/19 2050 09/03/19 0540  BP: (!) 93/56  97/60 117/77  Pulse: 67 78 65 66  Resp: _0 Temp: 97.9 F (36.6 C) 98.3 F (36.8 C) 97.8 F (36.6 C) 97.8 F (36.6 C)  TempSrc: Oral Oral Oral Oral  SpO2: 100% 100% 98% 100%  Weight:      Height:        General: alert, cooperative and no distress Lochia: appropriate Uterine Fundus: firm Incision: N/A DVT Evaluation: No evidence of DVT seen on physical exam. No significant calf/ankle edema. Labs: Lab Results  Component Value Date   WBC 8.6 09/01/2019   HGB 14.6 09/01/2019   HCT 42.2 09/01/2019   MCV 94.0 09/01/2019   PLT 207 09/01/2019   No flowsheet data found.  Discharge instruction: per After Visit Summary and "Baby and Me Booklet".  After visit meds:  Allergies as of 09/03/2019   No Known Allergies     Medication List    TAKE these medications   acetaminophen 325 MG tablet Commonly known as: Tylenol Take 2 tablets (650 mg total) by mouth every 4 (four) hours as needed for up to 14 days (for pain scale < 4).   ibuprofen 600 MG tablet Commonly known as:  ADVIL Take 1 tablet (600 mg total) by mouth every 6 (six) hours.   prenatal multivitamin Tabs tablet Take 1 tablet by mouth daily at 12 noon.       Diet: routine diet  Activity: Advance as tolerated. Pelvic rest for 6 weeks.   Outpatient follow up:6 weeks Follow up Appt:No future appointments. Follow up Visit: Follow-up Information    Department, [facility] Follow up in 6 week(s).   Contact information: [place] [address] [phone]            Pt of [facility], no message sent for postpartum visit    Newborn Data: Live born female  Birth Weight: 7 lb 10.2 oz (3465 g) APGAR: 9, 9  Newborn Delivery   Birth date/time: 09/01/2019 17:49:00 Delivery type: Vaginal, Spontaneous      Baby Feeding: Breast Disposition:home with mother   09/03/2019 Chauncey Mann, MD

## 2019-09-01 NOTE — Lactation Note (Signed)
This note was copied from a baby's chart. Lactation Consultation Note  Patient Name: Michele Stephens [id number] Date: 09/01/2019 Reason for consult: Initial assessment;Term  5 hours old FT female who is being partially BF and formula fed by her mother, she's a P2. Mom has some experience BF, she was able to BF her first child for 3 months. She participated in the [facility] program at the [facility] and she's already familiar with hand expression. When [facility] revised hand expression with mom she was able to get small droplet of colostrum out of her right breast, praised her for her efforts.  Mom was feeding baby a bottle when entering the room, offered assistance with latch but mom politely declined, she told LC "she didn't have any milk". Asked mom to call for assistance when needed. Reassured her that her colostrum was enough to meet baby's nutritional needs at the point, discussed size of baby's stomach, cluster feeding, feeding cues and formula supplementation guidelines according to baby's age in hours.   Feeding plan:  1. Encouraged mom to feed baby STS 8-12 times/24 hours or sooner if feeding cues are present 2. Hand expression and spoon feeding were also encouraged 3. She'll continue supplementing with gerber gentle according to formula supplementation guidelines per baby's age in hours   BF brochure (SP), BF resources (SP) and feeding diary (SP) were reviewed. Mom reported all questions and concerns were answered, she's aware of [place] OP services and will call PRN.  Maternal Data Formula Feeding for Exclusion: Yes Reason for exclusion: Mother's choice to formula and breast feed on admission Has patient been taught Hand Expression?: Yes Does the patient have breastfeeding experience prior to this delivery?: Yes  Feeding    LATCH Score                   Interventions Interventions: Breast feeding basics reviewed;Assisted with latch;Breast massage;Hand express;Breast  compression  Lactation Tools Discussed/Used WIC Program: Yes   Consult Status Consult Status: PRN    Wilbert Hayashi S Roselin Wiemann 09/01/2019, 11:14 PM

## 2019-09-01 NOTE — Progress Notes (Signed)
Spanish interpreter Raquel in room with patient during admission to [facility]. Baby safety and admission information reviewed with patient. FOB at bedside.

## 2019-09-01 NOTE — MAU Note (Signed)
Michele Stephens is a 28 y.o. at [redacted]w[redacted]d here in MAU reporting: states she has been having contractions since 2200 last night, they come about every 5-6 minutes. No bleeding or LOF. +FM. States she has not had a cervical exam. Reports no complications with pregnancy  Onset of complaint: last night at 2200  Pain score: 5/10  Vitals:   09/01/19 0759  BP: 118/69  Pulse: 88  Resp: 16  Temp: 98.1 F (36.7 C)  SpO2: 100%     FHT: +FM  Lab orders placed from triage: none

## 2019-09-01 NOTE — H&P (Signed)
Obstetric History and Physical  Harrie JeansYadira Vasquez Rios is a 28 y.o. G2P1001 with IUP at 1832w2d presenting for active labor. Presented to MAU for contractions, cervix dilated 8 cm.    Prenatal Course Source of Care: [facility]  with onset of care at 14 weeks Pregnancy complications or risks: Patient Active Problem List   Diagnosis Date Noted  . Indication for care in labor and delivery, antepartum 09/01/2019  . Indication for care in labor or delivery 03/13/2016   She plans to breastfeed She desires unknown for postpartum contraception.   Prenatal labs and studies: ABO, Rh:  O positive Antibody:  negative Rubella:  immune RPR:   negative HBsAg:   negative HIV:   negative GBS: negative 1hr GTT:  121 Genetic screening normal Anatomy US normal  Prenatal Transfer Tool  Maternal Diabetes: No Genetic Screening: Normal Maternal Ultrasounds/Referrals: Normal Fetal Ultrasounds or other Referrals:  None Maternal Substance Abuse:  No Significant Maternal Medications:  None Significant Maternal Lab Results: Group B Strep negative  Past Medical History:  Diagnosis Date  . Diabetes mellitus without complication (HCC)   . Gestational diabetes     Past Surgical History:  Procedure Laterality Date  . NO PAST SURGERIES      OB History  Gravida Para Term Preterm AB Living  2 1 1     1   SAB TAB Ectopic Multiple Live Births        0 1    # Outcome Date GA Lbr Len/2nd Weight Sex Delivery Anes PTL Lv  2 Current           1 Term 03/14/16 6191w3d 28:14 / 02:20 3435 g F Vag-Spont EPI, Local  LIV    Social History   Socioeconomic History  . Marital status: Single    Spouse name: Not on file  . Number of children: Not on file  . Years of education: Not on file  . Highest education level: Not on file  Occupational History  . Not on file  Social Needs  . Financial resource strain: Not on file  . Food insecurity    Worry: Not on file    Inability: Not on file  . Transportation needs     Medical: Not on file    Non-medical: Not on file  Tobacco Use  . Smoking status: Never Smoker  . Smokeless tobacco: Never Used  Substance and Sexual Activity  . Alcohol use: No  . Drug use: No  . Sexual activity: Yes    Birth control/protection: None  Lifestyle  . Physical activity    Days per week: Not on file    Minutes per session: Not on file  . Stress: Not on file  Relationships  . Social Musicianconnections    Talks on phone: Not on file    Gets together: Not on file    Attends religious service: Not on file    Active member of club or organization: Not on file    Attends meetings of clubs or organizations: Not on file    Relationship status: Not on file  Other Topics Concern  . Not on file  Social History Narrative  . Not on file    No family history on file.  Medications Prior to Admission  Medication Sig Dispense Refill Last Dose  . Prenatal Vit-Fe Fumarate-FA (PRENATAL MULTIVITAMIN) TABS tablet Take 1 tablet by mouth daily at 12 noon.       No Known Allergies  Review of Systems: Negative except for what  is mentioned in HPI.  Physical Exam: BP 129/82 (BP Location: Right Arm)   Pulse 83   Temp 98.1 F (36.7 C) (Oral)   Resp 16   Ht 5\' 2"  (1.575 m)   Wt 74.8 kg   LMP 11/24/2018 (Approximate)   SpO2 100%   BMI 30.18 kg/m  CONSTITUTIONAL: Well-developed, well-nourished female in no acute distress.  HENT:  Normocephalic, atraumatic, External right and left ear normal. Oropharynx is clear and moist EYES: Conjunctivae and EOM are normal. Pupils are equal, round, and reactive to light. No scleral icterus.  NECK: Normal range of motion, supple, no masses SKIN: Skin is warm and dry. No rash noted. Not diaphoretic. No erythema. No pallor. NEUROLOGIC: Alert and oriented to person, place, and time. Normal reflexes, muscle tone coordination. No cranial nerve deficit noted. PSYCHIATRIC: Normal mood and affect. Normal behavior. Normal judgment and thought  content. CARDIOVASCULAR: Normal heart rate noted, regular rhythm RESPIRATORY: Effort and breath sounds normal, no problems with respiration noted ABDOMEN: Soft, nontender, nondistended, gravid. MUSCULOSKELETAL: Normal range of motion. No edema and no tenderness. 2+ distal pulses.  Cervical Exam: Dilation: 8 Effacement (%): 90 Station: -2 Presentation: Vertex Exam by:: Fredda Hammed RN    Presentation: cephalic FHT:  NST:  Baseline: 120 bpm, Variability: Good {> 6 bpm), Accelerations: Reactive and Decelerations: Absent   Pertinent Labs/Studies:   No results found for this or any previous visit (from the past 24 hour(s)).  Assessment : Shequilla Goodgame is a 28 y.o. G2P1001 at [redacted]w[redacted]d being admitted for labor.  Plan: Labor: Expectant management.  Analgesia as needed. FWB: Reassuring fetal heart tracing.   GBS negative Delivery plan: Hopeful for vaginal delivery   Jorje Guild, NP

## 2019-09-02 ENCOUNTER — Encounter (HOSPITAL_COMMUNITY): Payer: Self-pay

## 2019-09-02 LAB — BIRTH TISSUE RECOVERY COLLECTION (PLACENTA DONATION)

## 2019-09-02 MED ORDER — ACETAMINOPHEN 325 MG PO TABS: 650.0000 mg | ORAL_TABLET | ORAL | 0 refills | Status: AC | PRN

## 2019-09-02 MED ORDER — IBUPROFEN 600 MG PO TABS: 600.0000 mg | ORAL_TABLET | Freq: Four times a day (QID) | ORAL | 0 refills | Status: DC

## 2019-09-03 DIAGNOSIS — Z3A39 39 weeks gestation of pregnancy: Secondary | ICD-10-CM

## 2019-09-03 NOTE — Lactation Note (Signed)
This note was copied from a baby's chart.  Lactation Consultation Note  Patient Name: Michele Stephens Date: 09/03/2019 Reason for consult: Follow-up assessment  LC in to visit with P2 Mom of term baby on day of discharge.  Baby 23 hrs old and at 2.5% weight loss.  Mom is both breastfeeding and formula feeding baby.    FOB holding baby with bottle to baby's lips.  FOB was letting baby suck on nipple tip as flow of milk too fast.  Showed him how to pace bottle feed baby.   Mom denies any problem latching baby or pain with latch.  Reviewed breast massage and hand expression, demonstrating this with Mom (with permission).  Transitional milk easily expressed.    Encouraged STS and cue based feeding, 8-12 feedings per 24 hrs.  Encouraged more breastfeeding than formula feeding to avoid engorgement.  Engorgement prevention and treatment reviewed.  Mom aware of OP lactation support available and encouraged parents to call prn for assistance.  Consult Status Consult Status: Complete Date: 09/03/19 Follow-up type: Call as needed    Broadus John 09/03/2019, 11:54 AM

## 2020-05-08 ENCOUNTER — Other Ambulatory Visit: Payer: Self-pay

## 2020-05-08 ENCOUNTER — Encounter: Payer: Self-pay | Admitting: Internal Medicine

## 2020-05-08 ENCOUNTER — Ambulatory Visit: Payer: Self-pay | Admitting: Internal Medicine

## 2020-05-08 ENCOUNTER — Encounter: Payer: Self-pay | Admitting: Clinical

## 2020-05-08 VITALS — BP 110/70 | HR 68 | Resp 12 | Ht 61.0 in | Wt 127.0 lb

## 2020-05-08 DIAGNOSIS — K047 Periapical abscess without sinus: Secondary | ICD-10-CM

## 2020-05-08 DIAGNOSIS — H11153 Pinguecula, bilateral: Secondary | ICD-10-CM

## 2020-05-08 DIAGNOSIS — O24419 Gestational diabetes mellitus in pregnancy, unspecified control: Secondary | ICD-10-CM | POA: Insufficient documentation

## 2020-05-08 MED ORDER — PENICILLIN V POTASSIUM 250 MG PO TABS: 250.0000 mg | ORAL_TABLET | Freq: Four times a day (QID) | ORAL | 0 refills | Status: DC

## 2020-05-08 MED ORDER — IBUPROFEN 200 MG PO TABS: ORAL_TABLET | ORAL | 0 refills | Status: DC

## 2020-05-08 NOTE — Patient Instructions (Signed)
Por favor reciba la vacuna COVID lo mas pronto posible.

## 2020-05-08 NOTE — Progress Notes (Signed)
Subjective:    Patient ID: Michele Stephens, female   DOB: [birth year], 29 y.o.   MRN: [id number]   HPI   Here to establish  [facility] interpreting.  1.  Wisdom tooth pain:  Left lower jaw.  Problems with the tooth for 2 years.  Was seen then by dentist when first started and diagnosed with a dental infection and treated.  Is having recurrent issues, this last time starting 4 days ago.  Does have inflammation of gingiva around tooth.  No drainage.  No fever.  She was taking Ibuprofen with Tylenol 1 tab daily as needed earlier this week.  Added Ketoralac 10 mg daily later with improved pain control.  Discussed both have antiinflammatory medication and should be careful.  2.  Eye concerns:  Little white bumps on whites of eyes and has noted she is getting redness on both eyes with these.  No pain.  Has used eyedrops to decrease redness--[employer] brand--helps with redness.    3.  ?Gestational DM vs DM:  Patient states she may have been diagnosed with DM outside of pregnancy at [facility].  She is not certain.  Current Meds  Medication Sig  . Ibuprofen-Acetaminophen (ADVIL DUAL ACTION) 125-250 MG TABS Take 1 tablet by mouth 1 day or 1 dose.  Marland Kitchen ketorolac (TORADOL) 10 MG tablet Take 10 mg by mouth every 6 (six) hours as needed.  . [DISCONTINUED] ibuprofen (ADVIL) 600 MG tablet Take 1 tablet (600 mg total) by mouth every 6 (six) hours.     No Known Allergies   Past Medical History:  Diagnosis Date  . Gestational diabetes     Past Surgical History:  Procedure Laterality Date  . NO PAST SURGERIES      History reviewed. No pertinent family history. Family Status  Relation Name Status  . Mother  Alive, age 93y  . Father  Alive, age 42y  . Sister  Alive  . Brother  Alive  . Daughter Juanetta Beets, age 28y  . Sister  Alive  . Sister  Alive  . Sister  Alive  . Daughter Elberta Fortis, age 79m    Social History   Social History Narrative   Lives at home with her 2 daughters.   Father of children is a boyfriend who does not live with them    Social History   Tobacco Use  . Smoking status: Never Smoker  . Smokeless tobacco: Never Used  Substance Use Topics  . Alcohol use: No  . Drug use: No      Review of Systems    Objective:   BP 110/70 (BP Location: Left Arm, Patient Position: Sitting, Cuff Size: Normal)   Pulse 68   Resp 12   Ht 5\' 1"  (1.549 m)   Wt 127 lb (57.6 kg)   LMP  (LMP Unknown)   Breastfeeding Yes   BMI 24.00 kg/m   Physical Exam  NAD HEENT:  PERRL, EOMI, More prominent on right temporal and left nasal eyes are piles of yellowish shiny soft tissue, but present on both eyes, both sides.  Mild encroachment of iris on right temporal and left nasal aspects.  No tension on iris.  TMs pearly gray. Partially erupted wisdom teeth bilaterally, lower jaw with mild swelling and tenderness over left lower wisdom tooth.  No erythema.   Neck:  Supple, No adenopathy, no thyromegaly Chest:  CTA CV:  RRR with normal S1 and S2, No S3, S4 or murmur.  Radial and DP  pulses normal and equal LE:  No edema   Assessment & Plan   1.  Likely impacted wisdom teeth with some inflammation about the left lower one:  Penicillin 250 mg 4 times daily for 7 days.   Increase Ibuprofen to 400-800 mg up to every 6 hours with food. Dental referral  2.  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  Discussed really nothing to do currently save for treating the redness as needed.  She would like to be seen by optometry.  Referral to [facility].  3.  Gestational DM vs Type II:  A1C  4.  COVID vaccine:  Spent significant time discussing COVID vaccination.  She would like to wait.  Not clear why.

## 2020-05-08 NOTE — Progress Notes (Signed)
Social worker met with new patient who is scheduled with Dr. Amil Amen for medical visit. Social worker completed New Patient Questionnaire which included completion of housing, intimate partner violence, transportation needs, stress, Emergency planning/management officer strain, food insecurity and screeners of PHQ9(0) and GAD-7(0).  Depression screen [facility] 2/9 05/08/2020 07/11/2015  Decreased Interest 0 0  Down, Depressed, Hopeless 0 0  PHQ - 2 Score 0 0  Altered sleeping 0 -  Tired, decreased energy 0 -  Change in appetite 0 -  Feeling bad or failure about yourself  0 -  Trouble concentrating 0 -  Moving slowly or fidgety/restless 0 -  Suicidal thoughts 0 -  PHQ-9 Score 0 -  Difficult doing work/chores Not difficult at all -   GAD 7 : Generalized Anxiety Score 05/08/2020  Nervous, Anxious, on Edge 0  Control/stop worrying 0  Worry too much - different things 0  Trouble relaxing 0  Restless 0  Easily annoyed or irritable 0  Afraid - awful might happen 0  Total GAD 7 Score 0  Anxiety Difficulty Not difficult at all     Social Connections: Somewhat Isolated  . Frequency of Communication with Friends and Family: More than three times a week  . Frequency of Social Gatherings with Friends and Family: Never  . Attends Religious Services: More than 4 times per year  . Active Member of Clubs or Organizations: No  . Attends Archivist Meetings: Never  . Marital Status: Never married    Social Determinants of Radio broadcast assistant Strain: Medium Risk  . Difficulty of Paying Living Expenses: Somewhat hard  Food Insecurity: No Food Insecurity  . Worried About Charity fundraiser in the Last Year: Never true  . Ran Out of Food in the Last Year: Never true  Transportation Needs: No Transportation Needs  . Lack of Transportation (Medical): No  . Lack of Transportation (Non-Medical): No  Physical Activity:   . Days of Exercise per Week:   . Minutes of Exercise per Session:   Stress: No  Stress Concern Present  . Feeling of Stress : Not at all  Social Connections: Somewhat Isolated  . Frequency of Communication with Friends and Family: More than three times a week  . Frequency of Social Gatherings with Friends and Family: Never  . Attends Religious Services: More than 4 times per year  . Active Member of Clubs or Organizations: No  . Attends Archivist Meetings: Never  . Marital Status: Never married  Intimate Partner Violence: Not At Risk  . Fear of Current or Ex-Partner: No  . Emotionally Abused: No  . Physically Abused: No  . Sexually Abused: No    Based on presentation no recommendations at the time of visit.

## 2020-05-09 LAB — HGB A1C W/O EAG: Hgb A1c MFr Bld: 5.1 % (ref 4.8–5.6)

## 2020-06-22 ENCOUNTER — Ambulatory Visit: Payer: Self-pay | Admitting: Internal Medicine

## 2020-10-16 ENCOUNTER — Ambulatory Visit: Payer: Self-pay | Admitting: Family Medicine

## 2020-10-16 ENCOUNTER — Encounter: Payer: Self-pay | Admitting: Family Medicine

## 2020-10-16 ENCOUNTER — Other Ambulatory Visit: Payer: Self-pay

## 2020-10-16 VITALS — BP 101/62 | HR 67 | Temp 98.7°F | Ht 61.0 in | Wt 121.4 lb

## 2020-10-16 DIAGNOSIS — N926 Irregular menstruation, unspecified: Secondary | ICD-10-CM

## 2020-10-16 LAB — POCT URINALYSIS DIP (CLINITEK)
Bilirubin, UA: NEGATIVE
Glucose, UA: NEGATIVE mg/dL
Ketones, POC UA: NEGATIVE mg/dL
Leukocytes, UA: NEGATIVE
Nitrite, UA: NEGATIVE
POC PROTEIN,UA: NEGATIVE
Spec Grav, UA: 1.025 (ref 1.010–1.025)
Urobilinogen, UA: 0.2 E.U./dL
pH, UA: 7.5 (ref 5.0–8.0)

## 2020-10-16 LAB — POCT URINE PREGNANCY: Preg Test, Ur: NEGATIVE

## 2020-10-16 NOTE — Progress Notes (Signed)
Patient ID: Michele Stephens, female    DOB: 1990-12-16  Age: 29 y.o. MRN: [id number]  Chief Complaint  Patient presents with  . Menstrual Problem    Irregular period. Bleed for a week then go away for a week then come back. This has been going on since Sept 2021     Subjective:   29 year old lady who is here with menstrual irregularity.  She had her first child 4 years ago, and when she stopped breast-feeding at 16 months her menstrual cycles returned to normal.  It took a little while and she ended up pregnant again.  She had a second child September 14 months ago.  She has been breast-feeding since then.  After 8 months she started having her cycles again.  She has not been using any contraception.  She did have periods fairly normally though not perfectly regularly for several months, then since September she has had on and off irregular bleeding.  Just has the bleeding.  Not much discomfort.  She is Spanish-speaking and we used an Camera operator.  Current allergies, medications, problem list, past/family and social histories reviewed.  Objective:  BP 101/62 (BP Location: Right Arm, Patient Position: Sitting, Cuff Size: Normal)   Pulse 67   Temp 98.7 F (37.1 C) (Temporal)   Ht 5\' 1"  (1.549 m)   Wt 121 lb 6.4 oz (55.1 kg)   LMP 10/15/2020   SpO2 99%   BMI 22.94 kg/m  Abdomen soft.  Nontender.  Pelvic exam: Normal external genitalia.  Vaginal Koza unremarkable.  She has a bloody egg white discharge from the os with streaking of blood.  Bimanual exam reveals no adnexal or ovarian masses.  Uterus is normal and midline.  Mild tenderness in the left ovarian region.  Assessment & Plan:   Assessment: 1. Irregular menstrual cycle       Plan: Explained things to the patient through the interpreter and then gave her my instructions.  If she comes back I will probably put on birth control pills for several months and get pelvic ultrasound on her.  See instructions.  Orders  Placed This Encounter  Procedures  . POCT urine pregnancy    No orders of the defined types were placed in this encounter.        Patient Instructions       If you have lab work done today you will be contacted with your lab results within the next 2 weeks.  If you have not heard from 13/03/2020 then please contact us. The fastest way to get your results is to register for My Chart.   IF you received an x-ray today, you will receive an invoice from [facility]. Please contact [facility] at [phone] with questions or concerns regarding your invoice.   IF you received labwork today, you will receive an invoice from [place]. Please contact [facility] at [phone] with questions or concerns regarding your invoice.   Our billing staff will not be able to assist you with questions regarding bills from these companies.  You will be contacted with the lab results as soon as they are available. The fastest way to get your results is to activate your My Chart account. Instructions are located on the last page of this paperwork. If you have not heard from [phone] regarding the results in 2 weeks, please contact this office.         No follow-ups on file.   [place], MD 10/16/2020

## 2020-10-16 NOTE — Patient Instructions (Addendum)
   Even though you have a little bit of tenderness in the left pelvic area, on examination everything seems normal.  The uterus and the area of the ovaries seems normal.  We did do a urine specimen on you which was normal.  No sign of infection.  We also did a pregnancy test which was negative.  You are not pregnant.  As we discussed, we will let things be let you do the irregular spotting over the next couple of months.  If it does not seem to be returning to normal over the next 2 months then I would like you to return and we will probably choose to put you on birth control pills for a while.  Usually if we regulate things with the birth control pills it will allow the body to get back into its regular rhythm.  If you do return because problems are persisting I will go ahead and order a pelvic ultrasound on you also though I do not expect to find anything abnormal.  Since you are not using anything to prevent pregnancy, if you stop having menstrual cycles you should check a pregnancy test to see if you got pregnant again.  If you have more itching in your vagina, try using some Monistat gel or Gyne-Lotrimin which are yeast treatments to see if that would take care of it.  Return if further problems.   If you have lab work done today you will be contacted with your lab results within the next 2 weeks.  If you have not heard from [place] then please contact us. The fastest way to get your results is to register for My Chart.   IF you received an x-ray today, you will receive an invoice from [facility]. Please contact [facility] Radiology at [phone] with questions or concerns regarding your invoice.   IF you received labwork today, you will receive an invoice from [facility]. Please contact [facility] at [phone] with questions or concerns regarding your invoice.   Our billing staff will not be able to assist you with questions regarding bills from these companies.  You will be  contacted with the lab results as soon as they are available. The fastest way to get your results is to activate your My Chart account. Instructions are located on the last page of this paperwork. If you have not heard from [place] regarding the results in 2 weeks, please contact this office.

## 2020-11-20 ENCOUNTER — Ambulatory Visit: Payer: Self-pay | Admitting: Internal Medicine

## 2020-12-25 ENCOUNTER — Encounter: Payer: Self-pay | Admitting: Obstetrics and Gynecology

## 2020-12-25 ENCOUNTER — Ambulatory Visit (INDEPENDENT_AMBULATORY_CARE_PROVIDER_SITE_OTHER): Payer: Self-pay | Admitting: Obstetrics and Gynecology

## 2020-12-25 ENCOUNTER — Other Ambulatory Visit: Payer: Self-pay

## 2020-12-25 VITALS — BP 122/78 | Ht 61.0 in | Wt 119.0 lb

## 2020-12-25 DIAGNOSIS — Z124 Encounter for screening for malignant neoplasm of cervix: Secondary | ICD-10-CM

## 2020-12-25 DIAGNOSIS — N898 Other specified noninflammatory disorders of vagina: Secondary | ICD-10-CM

## 2020-12-25 DIAGNOSIS — R102 Pelvic and perineal pain: Secondary | ICD-10-CM

## 2020-12-25 DIAGNOSIS — N926 Irregular menstruation, unspecified: Secondary | ICD-10-CM

## 2020-12-25 LAB — PREGNANCY, URINE: Preg Test, Ur: NEGATIVE

## 2020-12-25 LAB — WET PREP FOR TRICH, YEAST, CLUE

## 2020-12-25 NOTE — Progress Notes (Signed)
Michele Stephens [facility] [birth year] [id number]  SUBJECTIVE:  30 y.o. [id number] female new patient presents for evaluation of lower abdominal pain.  She is felt intermittent lower abdominal pain in the midline abdomen since the last 7 months, about when her daughter turned 9 months.  She did stop breast-feeding at that time.  She has had 2 prior vaginal deliveries.  Her older daughter is 21 years old.  She also used to have normal regular periods, but since cessation of nursing she has had menstrual irregularity, sometimes having her menstrual period for 1 week and then a week off and then returns again on the same month.  Not currently having any abdominal pain nor abnormal vaginal bleeding She is sexually active with her significant other and most recently this past few days.  She tells me she is not currently on any contraception (but then said that after she had presented to the health department for evaluation of the abdominal pain about 3 weeks ago, she was started on some sort of pills, which she thinks were birth control pills).  No abnormal vaginal discharge.  No fever or chills.  No dysuria symptoms.  She says her bowel movements are normal and regular and she is not having constipation.  Professional Spanish interpreter is present for the encounter today.   Current Outpatient Medications  Medication Sig Dispense Refill  . Multiple Vitamin (MULTIVITAMIN) capsule Take 1 capsule by mouth daily.     No current facility-administered medications for this visit.   Allergies: Patient has no known allergies.  Patient's last menstrual period was 12/12/2020.  Past medical history,surgical history, problem list, medications, allergies, family history and social history were all reviewed and documented as reviewed in the EPIC chart.  ROS: Pertinent positives and negatives as reviewed in HPI.   OBJECTIVE:  BP 122/78 (BP Location: Right Arm, Patient Position: Sitting, Cuff Size: Normal)   Ht 5\' 1"  (1.549  m)   Wt 119 lb (54 kg)   LMP 12/12/2020   BMI 22.48 kg/m  The patient appears well, alert, oriented, in no distress. ABDOMEN: General tenderness to deep palpation in the lower abdomen bilaterally and midline, no rebound or guarding PELVIC EXAM: VULVA: normal appearing vulva with no masses, tenderness or lesions, VAGINA: normal appearing vagina with normal color and discharge, no lesions, CERVIX: normal appearing cervix without discharge or lesions, UTERUS: uterus is normal size, shape, consistency and nontender, ADNEXA: normal adnexa in size, nontender and no masses,  PAP: Pap smear done today, thin-prep method, WET MOUNT done - results: negative for pathogens, normal epithelial cells Urine pregnancy test confirmed negative  Chaperone: 02/09/2021 Bonham present during the examination  ASSESSMENT:  30 y.o. 27 here for intermittent lower abdominal pain and menstrual irregularity  PLAN:  The patient was reassured that there are no findings on her examination today to suggest any abnormalities other than her generalized tenderness.  Vaginal wet mount is negative, urine pregnancy test negative.  No dysuria symptoms to warrant checking a UA at this time.  Did have a UA checked about 2 months ago elsewhere during her evaluation for this abdominal pain, and it was unremarkable other than small amount of blood on the dipstick but no sign of infection. I suggested to the patient that given the timing of the onset of the symptoms to when she stopped nursing her daughter and her unremarkable pelvic examination today I do not think an ultrasound is necessary.  I think she is likely experiencing an irregular bleeding  pattern due to hormonal imbalance and suggested that she give the birth control pill more time to work to try to remedy that.  If it does not then we can further investigate by checking TSH and other blood work and if her pain were to continue then we could also check an ultrasound.  I also  encouraged her to see if there is any temporal relationship between timing of her menstrual cycle and the pain, if there is, then this might suggest endometriosis, but she could not give [place] that sort of information today in her history.  We discussed that hormonal imbalances as the body adjusts after a recent pregnancy and 9 months of amenorrhea while breast-feeding, this could have an effect on uterine tissues leading to discomfort at times throughout the month, and I think hormonal contraception would probably fix that in time, but she needs to give it more than just a few weeks of birth control pill use.  She essentially demanded to have an ultrasound because she was not satisfied with that answer, so we went ahead and ordered the test for her.  Discussed with her that she does not have any palpable large ovarian cysts or myomas, and if in doing an ultrasound it is found that she has small myomas that are not palpable on exam, I warned her ahead of time that this might not necessarily be explaining her discomfort either.  We discussed that endometriosis can only be definitively diagnosed by surgical means.  We typically would start with empiric treatment with hormonal contraception (as she claims she is taking) and if that is not working within the first several months of use and we will need to proceed further with other work-up and treatment.  We will have the patient back for an ultrasound and further discussion at another time she will plan to make an appointment.   Extensive time was spent in a back-and-forth discussion and allowing time for language interpretation.   Theresia Majors MD 12/25/20

## 2021-04-09 LAB — PAP IG W/ RFLX HPV ASCU

## 2021-06-07 ENCOUNTER — Other Ambulatory Visit: Payer: Self-pay | Admitting: Nurse Practitioner

## 2021-06-07 DIAGNOSIS — R109 Unspecified abdominal pain: Secondary | ICD-10-CM

## 2021-06-18 ENCOUNTER — Ambulatory Visit
Admission: RE | Admit: 2021-06-18 | Discharge: 2021-06-18 | Disposition: A | Discharge status: Home / Self Care | Payer: No Typology Code available for payment source | Source: Ambulatory Visit | Attending: Nurse Practitioner | Admitting: Nurse Practitioner

## 2021-06-18 DIAGNOSIS — R109 Unspecified abdominal pain: Secondary | ICD-10-CM

## 2021-11-12 ENCOUNTER — Other Ambulatory Visit: Payer: No Typology Code available for payment source

## 2021-11-12 ENCOUNTER — Other Ambulatory Visit: Payer: Self-pay

## 2021-11-12 DIAGNOSIS — Z3490 Encounter for supervision of normal pregnancy, unspecified, unspecified trimester: Secondary | ICD-10-CM

## 2021-11-13 LAB — OBSTETRIC PANEL, INCLUDING HIV
Antibody Screen: NEGATIVE
Basophils Absolute: 0 10*3/uL (ref 0.0–0.2)
Basos: 0 %
EOS (ABSOLUTE): 0.1 10*3/uL (ref 0.0–0.4)
Eos: 1 %
HIV Screen 4th Generation wRfx: NONREACTIVE
Hematocrit: 33.2 % — ABNORMAL LOW (ref 34.0–46.6)
Hemoglobin: 11.2 g/dL (ref 11.1–15.9)
Hepatitis B Surface Ag: NEGATIVE
Immature Grans (Abs): 0 10*3/uL (ref 0.0–0.1)
Immature Granulocytes: 0 %
Lymphocytes Absolute: 1.6 10*3/uL (ref 0.7–3.1)
Lymphs: 17 %
MCH: 31.1 pg (ref 26.6–33.0)
MCHC: 33.7 g/dL (ref 31.5–35.7)
MCV: 92 fL (ref 79–97)
Monocytes Absolute: 0.5 10*3/uL (ref 0.1–0.9)
Monocytes: 6 %
Neutrophils Absolute: 7.2 10*3/uL — ABNORMAL HIGH (ref 1.4–7.0)
Neutrophils: 76 %
Platelets: 265 10*3/uL (ref 150–450)
RBC: 3.6 x10E6/uL — ABNORMAL LOW (ref 3.77–5.28)
RDW: 12.7 % (ref 11.7–15.4)
RPR Ser Ql: NONREACTIVE
Rh Factor: POSITIVE
Rubella Antibodies, IGG: 1.04 index (ref >0.99)
WBC: 9.4 10*3/uL (ref 3.4–10.8)

## 2021-11-19 ENCOUNTER — Other Ambulatory Visit: Payer: No Typology Code available for payment source

## 2021-11-19 ENCOUNTER — Ambulatory Visit (INDEPENDENT_AMBULATORY_CARE_PROVIDER_SITE_OTHER): Payer: Self-pay | Admitting: Family Medicine

## 2021-11-19 ENCOUNTER — Other Ambulatory Visit: Payer: Self-pay

## 2021-11-19 ENCOUNTER — Encounter: Payer: Self-pay | Admitting: Family Medicine

## 2021-11-19 ENCOUNTER — Other Ambulatory Visit (HOSPITAL_COMMUNITY)
Admission: RE | Admit: 2021-11-19 | Discharge: 2021-11-19 | Disposition: A | Discharge status: Home / Self Care | Payer: No Typology Code available for payment source | Source: Ambulatory Visit | Attending: Family Medicine | Admitting: Family Medicine

## 2021-11-19 VITALS — BP 119/69 | HR 91 | Wt 135.6 lb

## 2021-11-19 DIAGNOSIS — Z3490 Encounter for supervision of normal pregnancy, unspecified, unspecified trimester: Secondary | ICD-10-CM | POA: Insufficient documentation

## 2021-11-19 LAB — POCT URINALYSIS DIP (CLINITEK)
Bilirubin, UA: NEGATIVE
Blood, UA: NEGATIVE
Glucose, UA: NEGATIVE mg/dL
Ketones, POC UA: NEGATIVE mg/dL
Leukocytes, UA: NEGATIVE
Nitrite, UA: NEGATIVE
POC PROTEIN,UA: NEGATIVE
Spec Grav, UA: 1.01 (ref 1.010–1.025)
Urobilinogen, UA: 0.2 E.U./dL
pH, UA: 7 (ref 5.0–8.0)

## 2021-11-19 LAB — POCT 1 HR PRENATAL GLUCOSE: Glucose 1 Hr Prenatal, POC: 143 mg/dL

## 2021-11-19 MED ORDER — DOXYLAMINE SUCCINATE (SLEEP) 25 MG PO TABS: 12.5000 mg | ORAL_TABLET | Freq: Every evening | ORAL | 0 refills | Status: DC | PRN

## 2021-11-19 MED ORDER — VITAMIN B-6 25 MG PO TABS: 25.0000 mg | ORAL_TABLET | Freq: Four times a day (QID) | ORAL | 1 refills | Status: DC

## 2021-11-19 NOTE — Patient Instructions (Signed)
Cuidados prenatales Prenatal Care El cuidado prenatal es la atencin de la salud durante el embarazo. Ayuda a que usted y su beb en gestacin (feto) se mantengan tan saludables como sea posible. El cuidado prenatal puede ser proporcionado por una partera, un mdico de familia, un profesional de nivel medio (enfermero especializado o auxiliar mdico) o un mdico especializado en embarazo y parto (obstetra). De qu modo me afecta? Durante el embarazo, la controlarn minuciosamente para detectar cualquier afeccin que pudiera desarrollar. Para disminuir el riesgo de sufrir complicaciones durante el embarazo, usted y el mdico hablarn acerca de las afecciones subyacentes que tenga. Cmo afecta esto al beb? El cuidado prenatal recibido desde un principio y de forma peridica aumenta la probabilidad de que su beb permanezca sano durante el embarazo. El cuidado prenatal disminuye el riesgo de que el beb: [place] de forma temprana (prematuramente). Sea ms pequeo de lo esperado al nacer (pequeo para la edad gestacional). Qu puedo esperar en la primera visita de cuidado prenatal? Su primera visita de cuidado prenatal probablemente ser la ms larga. Programe la primera visita de cuidado prenatal tan pronto como advierta que est embarazada. Su primera visita es un buen momento para hacer preguntas o hablar sobre las inquietudes que tenga acerca del embarazo. Antecedentes mdicos En su visita, usted y el mdico hablarn sobre sus antecedentes mdicos, incluidos: Cualquier embarazo anterior. Sus antecedentes mdicos familiares. Los antecedentes mdicos del padre del beb. Cualquier afeccin de salud a largo plazo (crnica) que tenga y cmo controlarla. Cirugas o procedimientos a los que se someti. Consumo actual de medicamentos recetados o de venta libre, hierbas o suplementos. Otros factores que podran representar un riesgo para el beb, incluidos los siguientes: Exposicin a sustancias  qumicas nocivas o a radiacin en el trabajo o en su casa. Consumo de sustancias, incluidos el tabaco, el alcohol y las drogas. Su entorno en el hogar y sus niveles de estrs, por ejemplo: Exposicin al abuso o la violencia. Problemas econmicos en casa. Sus hbitos de salud diarios, incluida la dieta y la actividad fsica. Pruebas y exmenes Su mdico: Medir su peso, altura y presin arterial. Le realizar un examen fsico, incluido un examen plvico y mamario. Le realizar anlisis de sangre y orina para detectar: Infeccin de las vas urinarias. Enfermedades de transmisin sexual (ETS). Niveles bajos de hierro en la sangre (anemia). Grupo sanguneo y ciertas protenas en los glbulos rojos (anticuerpos Rh). Infecciones e inmunidad a los virus, como el de la hepatitis B y la rubola. VIH (virus de inmunodeficiencia humana). Analizar sus opciones de estudios de deteccin genticos. Consejos para mantener la salud El mdico tambin le brindar informacin acerca de cmo mantenerse sana y mantener sano a su beb, por ejemplo: Nutricin y vitaminas. Actividad fsica. Cmo controlar los sntomas del embarazo, como nuseas y vmitos (nuseas matutinas). Infecciones y sustancias que pueden ser perjudiciales para el beb, y cmo evitarlas. Salubridad de los alimentos. Cuidado dental. Trabajo. Viajes. Signos de advertencia a los que debe estar atenta y cundo llamar al mdico. Con qu frecuencia tendr que asistir a visitas de cuidado prenatal? Despus de la primera visita de cuidado prenatal, tendr que asistir a visitas en forma regular durante el embarazo. El cronograma de visitas a menudo es el siguiente: Hasta la semana 28 de embarazo: una vez cada 4 semanas. Desde la semana 28 a la 36: una vez cada 2 semanas. Despus de la semana 36: todas las semanas hasta el parto. Es posible que algunas mujeres deban asistir a visitas con   mayor o Media planner segn las afecciones  subyacentes de salud que tengan y la salud del beb. Concurra a todas las visitas de cuidado prenatal y de seguimiento. Esto es importante. Qu sucede durante las visitas rutinarias de cuidado prenatal? Su mdico: Medir su peso y presin arterial. Verificar la presencia de sonidos cardacos fetales. Medir la altura de su tero y abdomen (altura uterina). Esta puede medirse aproximadamente a partir de la semana 20 del [place]. Controlar la posicin del beb dentro del tero. Le har preguntas acerca de su dieta, patrones de sueo y si puede sentir los movimientos del beb. Revisar los signos de advertencia a los que debe estar atenta y los signos del trabajo de [place]. Le preguntar acerca de los sntomas relacionados con el embarazo que tenga y cmo est lidiando con ellos. Los sntomas pueden incluir: Dolores de [place]. Nuseas y vmitos. Secrecin vaginal. Hinchazn. Fatiga. Estreimiento. Cambios en la visin. Sentir tristeza o ansiedad constantemente. Cualquier molestia, incluido el dolor plvico o de espalda. Manchado o sangrado. Haga una lista de las preguntas que tenga para hacerle al mdico en las visitas de [place]. Qu estudios me podran Environmental education officer las visitas de cuidado prenatal? Es posible que le realicen anlisis de [place], [place] y estudios de diagnstico por imgenes durante todo el [place], como los siguientes: Este anlisis examina la presencia de glucosa, protenas o signos de infeccin en la orina. Pruebas de glucosa para detectar una forma de diabetes que pueda desarrollarse durante el embarazo (diabetes mellitus gestacional). Generalmente, esto se realiza alrededor de la semana 24 de [place]. Ecografas para verificar el crecimiento y desarrollo del beb, detectar defectos congnitos y TEFL teacher del beb. Estas tambin pueden ayudar a decidir cundo debe dar a luz al beb. Un estudio para Psychologist, clinical infeccin por estreptococos del grupo B  (EGB). Generalmente, esto se realiza alrededor de la semana 64 de embarazo. Pruebas genticas. Estas pueden incluir anlisis de [place], lquidos o tejidos, o estudios de diagnstico por imgenes, como una ecografa. Algunos estudios genticos se Administrator, sports trimestre de Media planner y otros durante el segundo trimestre. Qu otras cosas puedo esperar durante las visitas de cuidado prenatal? El mdico puede recomendarle que se aplique algunas vacunas durante el [place]. Pueden incluir: Una vacuna contra la gripe anual. Esto es especialmente importante si estar embarazada durante la temporada de gripe. La vacuna Tdap (ttanos, difteria y [place]). Recibir esta vacuna durante el embarazo puede proteger al beb de la tos convulsa (tos ferina) despus del nacimiento. Esta vacuna puede recomendarse [employer] [address] de [place]. Una vacuna contra el COVID-19. Ms adelante en su [place]ink, su mdico puede proporcionarle informacin acerca de: Clases para prepararse para el parto y Comptroller. Cmo elegir un mdico para el beb. Bancos de cordn umbilical. Lactancia materna. Utilizacin de mtodos anticonceptivos despus del nacimiento del beb. La unidad de parto y [place] de parto del hospital, y cmo programar una visita. Cmo registrarse en el hospital antes de comenzar el [place] de [place]. Dnde buscar ms informacin Office on [employer] ([place]): LegalWarrants.gl American Pregnancy Association (Asociacin Americana del [place]): americanpregnancy.org March of Dimes: marchofdimes.org Resumen El cuidado prenatal [place] a que usted y su beb se mantengan tan saludables como sea posible durante el [place]. Su primera visita de cuidado prenatal probablemente ser la ms larga. Tendr que asistir a visitas y Dispensing optician estudios durante todo el [place] para Chief Technology Officer su salud y la salud del beb.  Lleve una lista de preguntas para hacerle al  mdico durante las visitas. Asegrese de asistir a todas las visitas de cuidado prenatal y de Clinical research associate. Esta informacin no tiene Theme park manager el consejo del mdico. Asegrese de hacerle al mdico cualquier pregunta que tenga. Document Revised: 10/06/2020 Document Reviewed: 10/06/2020 Elsevier Patient Education  2022 [employer].

## 2021-11-19 NOTE — Progress Notes (Signed)
Patient Name: Michele Stephens Date of Birth: [birth year] [facility] Initial Prenatal Visit  Michele Stephens is a 30 y.o. year old G2P2002 at Unknown who presents for her initial prenatal visit. Pregnancy is planned She reports nausea. She is taking a prenatal vitamin.  She denies pelvic pain or vaginal bleeding.   Pregnancy Dating: The patient is dated by LMP.  LMP: 08/24 Period is certain:  Yes.  Periods were regular:  Yes.  LMP was a typical period:  Yes.  Using hormonal contraception in 3 months prior to conception: No  Lab Review: Blood type: O Rh Status: + Antibody screen: Negative HIV: Negative RPR: Negative Hemoglobin electrophoresis reviewed: No, ordered today Results of OB urine culture are: Not done, ordered today Rubella: Immune Varicella status is Unknown  PMH: Reviewed and as detailed below: HTN: No  Type 1 or 2 Diabetes: No  Depression:  No  Seizure disorder:  No VTE: No ,  History of STI No,  Abnormal Pap smear:  No, Genital herpes simplex:  No   PSH: Gynecologic Surgery:  no Surgical history reviewed: None  Obstetric History: Obstetric history tab updated and reviewed.  Summary of prior pregnancies: [id number] Cesarean delivery: No  Gestational Diabetes:  Yes Hypertension in pregnancy: No History of preterm birth: No History of LGA/SGA infant:  No History of shoulder dystocia: No Indications for referral were reviewed, and the patient has no obstetric indications for referral to High Risk OB Clinic at this time.   Social History: Partner's name: Ernst Spell  Tobacco use: No Alcohol use:  No Other substance use:  No  Current Medications:  Prenatal Complex B Dramamine 1/2 tab couple of times.  Reviewed and appropriate in pregnancy.   Genetic and Infection Screen: Flow Sheet Updated Yes  Prenatal Exam: Gen: Well nourished, well developed.  No distress.  Vitals noted. HEENT: Normocephalic, atraumatic.  Neck  supple without cervical lymphadenopathy, thyromegaly or thyroid nodules.  Fair dentition. CV: RRR no murmur, gallops or rubs Lungs: CTA B.  Normal respiratory effort without wheezes or rales. Abd: soft, NTND. +BS.  Uterus not appreciated above pelvis. GU: Normal external female genitalia without lesions.  Nl vaginal, well rugated without lesions. No vaginal discharge.  Bimanual exam: No adnexal mass or TTP. No CMT.  Uterus size n/a Ext: No clubbing, cyanosis or edema. Psych: Normal grooming and dress.  Not depressed or anxious appearing.  Normal thought content and process without flight of ideas or looseness of associations  Fetal heart tones: Not detected and discussed with preceptor, ultrasound obtained  Bedside POC u/cs attempted with Dr Miquel Dunn.  Able to see cardiac movement, unable to count or doppler FHT.  Likely due to early pregnancy.  Assessment/Plan:  Michele Stephens is a 25 y.o. [phone] at Unknown who presents to initiate prenatal care. She is doing well.  Current pregnancy issues include.  Routine prenatal care: As dating is reliable, a dating ultrasound has not been ordered. Dating tab updated. Pre-pregnancy weight updated. Expected weight gain this pregnancy is 25-35 pounds  Prenatal labs reviewed. Indications for referral to HROB were reviewed and the patient does not meet criteria for referral.  Medication list reviewed and updated.  Recommended patient see a dentist for regular care.  Bleeding and pain precautions reviewed. Importance of prenatal vitamins reviewed.  Genetic screening offered. Patient opted for: patient undecided, will address at future visit. The patient will not be age 69 or over at time of delivery. Referral to genetic counseling  was not offered today.  The patient has the following risk factors for preexisting diabetes: BMI >25 and history of GDM . An early 1 hour glucose tolerance test was ordered. Pregnancy Medical Home and PHQ-9 forms completed,  problems noted: Yes  2. Pregnancy issues include the following which were addressed today:  Elevated early Glucola. 3h Glucola testing ordered and scheduled lab visit History of gDM in last pregnancy.  Likely will need referral to HROB pending next glucose test Nausea in pregnancy: Vit B and Doxylamine    Follow up 4 weeks for next prenatal visit.   Dana Allan, MD Family Medicine Residency    Next Visit Follow up initial labs Anatomy scan 12/19

## 2021-11-21 LAB — CULTURE, OB URINE

## 2021-11-21 LAB — URINE CULTURE, OB REFLEX

## 2021-11-22 LAB — CERVICOVAGINAL ANCILLARY ONLY
Bacterial Vaginitis (gardnerella): NEGATIVE
Candida Glabrata: NEGATIVE
Candida Vaginitis: NEGATIVE
Chlamydia: NEGATIVE
Comment: NEGATIVE
Comment: NEGATIVE
Comment: NEGATIVE
Comment: NEGATIVE
Comment: NEGATIVE
Comment: NORMAL
Neisseria Gonorrhea: NEGATIVE
Trichomonas: NEGATIVE

## 2021-11-23 LAB — HCV AB W REFLEX TO QUANT PCR: HCV Ab: <0.1 s/co ratio (ref 0.0–0.9)

## 2021-11-23 LAB — HCV INTERPRETATION

## 2021-11-23 LAB — HGB FRACTIONATION CASCADE
Hgb A2: 2.8 % (ref 1.8–3.2)
Hgb A: 97.2 % (ref 96.4–98.8)
Hgb F: 0 % (ref 0.0–2.0)
Hgb S: 0 %

## 2021-11-23 LAB — VARICELLA ZOSTER ANTIBODY, IGG: Varicella zoster IgG: 349 index (ref >165)

## 2021-11-25 ENCOUNTER — Telehealth: Payer: Self-pay | Admitting: *Deleted

## 2021-11-25 NOTE — Progress Notes (Signed)
° ° °  SUBJECTIVE:   CHIEF COMPLAINT / HPI:   Headache/cough/congestion: 30 year old G3, P2 at 16-week 5 day gestation presenting with the above symptoms. She states her headaches started around Tuesday but improved around Thursday. She still has a cough which she has had for about 7 days.   Denies vaginal bleeding, gush of fluid, contractions.  Ipad interpretor used.  PERTINENT  PMH / PSH: Current pregnancy  OBJECTIVE:   BP 97/61    Pulse 87    Ht 5\' 1"  (1.549 m)    Wt 134 lb 9.6 oz (61.1 kg)    LMP 08/04/2021 (Exact Date)    SpO2 100%    BMI 25.43 kg/m    Fetal HR of 144  General: NAD, pleasant, able to participate in exam HEENT: No pharyngeal erythema Cardiac: RRR, no murmurs. Respiratory: Mostly clear, some and expiratory wheezing present more on the left than the right, no respiratory distress on room air, no crackles. Abdomen: Bowel sounds present, nontender Skin: warm and dry, no rashes noted  ASSESSMENT/PLAN:   Headache/cough/congestion: Headache is since resolved.  Patient having some mild cough and congestion since Tuesday of last week.  He seems to be slowly improving.  She endorses no shortness of breath.  She is not have any fevers.  On physical exam her lungs are mostly clear with just a few faint end expiratory wheezing present.  I do not think there is a need for viral testing at this time as it would not change any management since her symptoms have been going on for approximately a week.  Discussed over-the-counter medications safe in pregnancy including Tylenol, cough drops, regular Mucinex.  Discussed return precautions.  Patient has upcoming OB appointment which she will go to.  She will reach out if she has any concerns in the meantime.  Monday, DO [facility]

## 2021-11-25 NOTE — Telephone Encounter (Signed)
Pt calls because she has an appt for cold symptoms ( Headache / cough/ fever) on Monday and wants to know what she can use OTC.  Advised that she can use robitussin DM, mucinex and tylenol as well as a humidifier and vicks vapo-rub.  She did use a cold medication that contained dextromethorphan x 1.  Advised to not use that particular medication again.  Jerre Simon [id number] used via phone for entire call. Jone Baseman, CMA

## 2021-11-26 ENCOUNTER — Other Ambulatory Visit: Payer: No Typology Code available for payment source

## 2021-11-29 ENCOUNTER — Ambulatory Visit (INDEPENDENT_AMBULATORY_CARE_PROVIDER_SITE_OTHER): Payer: Self-pay | Admitting: Family Medicine

## 2021-11-29 ENCOUNTER — Other Ambulatory Visit: Payer: Self-pay

## 2021-11-29 ENCOUNTER — Encounter: Payer: Self-pay | Admitting: Family Medicine

## 2021-11-29 VITALS — BP 97/61 | HR 87 | Ht 61.0 in | Wt 134.6 lb

## 2021-11-29 DIAGNOSIS — R051 Acute cough: Secondary | ICD-10-CM

## 2021-11-29 DIAGNOSIS — R0981 Nasal congestion: Secondary | ICD-10-CM

## 2021-11-29 NOTE — Patient Instructions (Addendum)
I am glad your headache has improved and that your cough is getting better.  You can consider cough drops or Mucinex for the cough and wheezing.  If you develop any trouble breathing, worsening of wheezing, high fevers, shortness of breath, or any other concerning symptoms do not hesitate to follow-up.

## 2021-12-03 ENCOUNTER — Other Ambulatory Visit: Payer: Self-pay

## 2021-12-03 ENCOUNTER — Other Ambulatory Visit: Payer: No Typology Code available for payment source

## 2021-12-03 DIAGNOSIS — Z3490 Encounter for supervision of normal pregnancy, unspecified, unspecified trimester: Secondary | ICD-10-CM

## 2021-12-04 LAB — GESTATIONAL GLUCOSE TOLERANCE
Glucose, Fasting: 80 mg/dL (ref 70–94)
Glucose, GTT - 1 Hour: 150 mg/dL (ref 70–179)
Glucose, GTT - 2 Hour: 134 mg/dL (ref 70–154)
Glucose, GTT - 3 Hour: 103 mg/dL (ref 70–139)

## 2021-12-12 NOTE — L&D Delivery Note (Signed)
Patient: Michele Stephens MRN: [id number]  GBS status: Negative  Patient is a 31 y.o. now G3P3 s/p NSVD at [redacted]w[redacted]d, who was admitted for IOL for post dates. AROM 1h 4m prior to delivery with thick meconium fluid.    Delivery Note At 9:17 PM a viable female was delivered via Vaginal, Spontaneous (Presentation: direct occiput anterior).  APGAR: 8, 8; weight  .   Placenta status: Spontaneous, Intact.  Cord: 3 vessels with the following complications: None.   Anesthesia: None Episiotomy: None Lacerations: None Est. Blood Loss (mL): 75  Mom to postpartum.  Baby to Couplet care / Skin to Skin.  Derrel Nip 05/18/2022, 9:37 PM  Head delivered direct OA. No nuchal cord present. Shoulder delivered after direct pressure was applied to the posterior aspect of fetuses right shoulder and body delivered in usual fashion. Infant with spontaneous cry, placed on mother's abdomen, dried and bulb suctioned. Cord clamped x 2 after 1-minute delay, and cut by FOB. Cord blood drawn. Placenta delivered spontaneously with gentle cord traction. Fundus firm with massage and Pitocin. Perineum inspected and found to have no laceration.

## 2021-12-15 ENCOUNTER — Encounter: Payer: No Typology Code available for payment source | Admitting: Family Medicine

## 2021-12-16 ENCOUNTER — Encounter: Payer: No Typology Code available for payment source | Admitting: Family Medicine

## 2021-12-22 ENCOUNTER — Other Ambulatory Visit: Payer: Self-pay

## 2021-12-22 ENCOUNTER — Ambulatory Visit (INDEPENDENT_AMBULATORY_CARE_PROVIDER_SITE_OTHER): Payer: Self-pay | Admitting: Student

## 2021-12-22 VITALS — BP 109/64 | HR 89 | Wt 139.8 lb

## 2021-12-22 DIAGNOSIS — Z3492 Encounter for supervision of normal pregnancy, unspecified, second trimester: Secondary | ICD-10-CM

## 2021-12-22 NOTE — Patient Instructions (Addendum)
Precauciones de devolución relacionadas con el embarazo ° °Los siguientes son signos/síntomas que son anormales en el embarazo y pueden requerir una evaluación adicional por parte de un médico: °Vaya a MAU en [facility] [place] si: °Tiene calambres/contracciones que no desaparecen con agua potable, especialmente si duran de 30 segundos a 1,5 minutos, aparecen y desaparecen cada 5-10 minutos durante una hora o más, o si son cada vez más fuertes y no puede caminar o hablar mientras tener una contracción/calambre. °Tu agua se rompe. A veces es un gran chorro de líquido, a veces es solo un goteo que sigue mojando tu ropa interior o corriendo por tus piernas. °Tiene sangrado vaginal. °No siente que su bebé se mueva normalmente. Si no lo hace, busque algo para comer y beber (algo frío o algo con azúcar como mantequilla de maní o jugo) y acuéstese y concéntrese en sentir cómo se mueve su bebé. Si su bebé todavía no se mueve con normalidad, debe ir a MAU. Debe sentir que su bebé se mueve 6 veces en una hora o 10 veces en dos horas. °Tiene un dolor de cabeza persistente que no desaparece con 1 g de Tylenol, cambios en la visión, dolor en el pecho, dificultad para respirar, dolor intenso en la parte superior derecha del abdomen, empeoramiento de la hinchazón de las piernas; todos estos pueden ser signos de presión arterial alta en el embarazo y necesita para ser evaluado por un proveedor inmediatamente ° °Todos estos son preocupantes durante el embarazo y, si tiene alguno de estos, le recomiendo que llame a su PCP y se presente a la Unidad de Admisiones de Maternidad (mapa a continuación) para una evaluación adicional. ° °Para cualquier emergencia relacionada con el embarazo, diríjase a la Unidad de Admisiones de Maternidad en el [facility] el [facility]. Usará la Entrada C del hospital. ° °  °

## 2021-12-22 NOTE — Progress Notes (Signed)
°  Patient Name: Michele Stephens Date of Birth: 18-Sep-1991 [facility] Prenatal Visit  [name] is a 31 y.o. [id number] at [redacted]w[redacted]d here for routine follow up. She is dated by LMP.  She reports no complaints, nausea has resolved.  She denies vaginal bleeding.  See flow sheet for details.  Vitals:   12/22/21 1623  BP: 109/64  Pulse: 89   Gen: Well nourished, well developed.  No distress.  Vitals noted. HEENT: Normocephalic, atraumatic.  Neck supple without cervical lymphadenopathy, thyromegaly or thyroid nodules.  Fair dentition. CV: RRR no murmur, gallops or rubs Lungs: CTA B.  Normal respiratory effort without wheezes or rales. Abd: soft, NTND. +BS.  Uterine fundus  2 in below umbillicus. Ext: No clubbing, cyanosis or edema. Psych: Normal grooming and dress.  Not depressed or anxious appearing.  Normal thought content and process without flight of ideas or looseness of associations.  Fetal heart tones easily detected with doppler today.  A/P: Pregnancy at [redacted]w[redacted]d.  Doing well.  Has appointment on 1/23 for anatomy US.  Routine Prenatal Care:  Dating reviewed, dating tab is correct Fetal heart tones  appropriate, 130-160s bpm* Influenza vaccine not administered as patient declined, will continue to discuss.   COVID vaccination was discussed and declined.  Anatomy ultrasound ordered to be scheduled at 18-20 weeks. Patient is not interested in genetic screening. As she is past 13 weeks and 6 days, a Quad screen  was offered.  Pregnancy education including expected weight gain in pregnancy, OTC medication use, continued use of prenatal vitamin, smoking cessation if applicable, and nutrition in pregnancy.   Bleeding and pain precautions reviewed. The patient has the following indications for aspirinto begin 81 mg at 12-16 weeks: One high risk condition: no single high risk condition  MORE than one moderate risk condition: low SES   Aspirin was not  recommended  today based upon above risk factors (one high risk condition or more than one moderate risk factor)   2. Pregnancy issues include the following and were addressed as appropriate today:  Return precautions- LOF, VB, s/sx's pre-E, decreased fetal movement. Explained where MAU is located. Normal 3h glucola. (Had hx gDM in prior pregnancy) Problem list  and pregnancy box updated: Yes.   Follow up 4 weeks.

## 2022-01-20 ENCOUNTER — Other Ambulatory Visit: Payer: Self-pay

## 2022-01-20 ENCOUNTER — Ambulatory Visit (INDEPENDENT_AMBULATORY_CARE_PROVIDER_SITE_OTHER): Payer: Self-pay | Admitting: Family Medicine

## 2022-01-20 VITALS — BP 116/70 | HR 103 | Wt 149.0 lb

## 2022-01-20 DIAGNOSIS — Z124 Encounter for screening for malignant neoplasm of cervix: Secondary | ICD-10-CM

## 2022-01-20 DIAGNOSIS — Z3492 Encounter for supervision of normal pregnancy, unspecified, second trimester: Secondary | ICD-10-CM

## 2022-01-20 NOTE — Progress Notes (Signed)
[facility] Visit  Michele Stephens is a 31 y.o. [id number] at [redacted]w[redacted]d (via LMP) who presents to [facility] for routine follow up. Seen today along with resident Dr. Melissa Noon. Prenatal course, history, notes, ultrasounds, and laboratory results reviewed.  Denies cramping/ctx, fluid leaking, vaginal bleeding, or decreased fetal movement. Taking PNV.    Primary Prenatal Care Provider: [name]  Postpartum Plans: - delivery planning: SVD, unsure for pain control might like epidural (last delivery happened quickly and she was unable to get an epidural) - circumcision: N/a female - feeding: breast - pediatrician: [name] - contraception: condoms  FHR: 142 bpm Uterine size: 24 cm142  Assessment & Plan  1. Routine prenatal care: - Pap smear due (last one normal in 2016), declines today, will do at next visit - Discussed flu and COVID vaccination in pregnancy, stressed risks of infection and importance, given letter for HD today  2. Failed early 1 hr GTT, but passed early 3 hour GTT. Patient denies a history of GDM in previous pregnancies. Will repeat 3 hour GTT at next appt. Discussed needs to be fasting for this.  Next prenatal visit in 4 weeks - needs 3 hr GTT (as she previously failed 1 hour GTT), CBC, HIV, RPR, needs pap smear, and T-dap vaccination. Discussed with patient she needs to be fasting. Labor & fetal movement precautions discussed.  Burley Saver, MD [facility]

## 2022-01-20 NOTE — Patient Instructions (Addendum)
Please make a follow up in one month.  Precauciones de devolucin relacionadas con Reginia Naas  Los siguientes son signos/sntomas que son anormales en el Psychiatrist y pueden requerir una evaluacin adicional por parte de un mdico: Vaya a MAU en [facility] [place] si: Tiene calambres/contracciones que no desaparecen con agua potable, especialmente si duran de 30 segundos a 1,5 minutos, aparecen y desaparecen cada 5-10 minutos durante una hora o ms, o si son cada vez ms fuertes y no puede caminar o Physiological scientist. Tu agua se rompe. A veces es un gran chorro de lquido, a veces es solo un goteo que sigue mojando tu ropa interior o corriendo por tus piernas. Tiene sangrado vaginal. No siente que su beb se mueva normalmente. Si no lo hace, busque algo para comer y beber (algo fro o algo con azcar [employer] de man o [place]) y acustese y concntrese en sentir cmo se mueve su beb. Si su beb todava no se mueve con normalidad, debe ir a MAU. Debe sentir que su beb se mueve 6 veces en una hora o 10 veces en dos horas. Tiene un dolor de cabeza persistente que no desaparece con 1 g de Tylenol, cambios en la visin, dolor en el pecho, dificultad para respirar, dolor intenso en la parte superior derecha del abdomen, empeoramiento de la hinchazn de las piernas; todos estos pueden ser signos de presin arterial alta en el embarazo y necesita para ser evaluado por un proveedor inmediatamente  [place] son preocupantes durante el Psychiatrist y, si tiene alguno de [place], le recomiendo que llame a su PCP y se presente a Furniture conservator/restorer de Admisiones de Maternidad (mapa a continuacin) para Hotel manager.  Para cualquier emergencia relacionada con Firefighter, dirjase a la [place] de Admisiones de Maternidad en [address] de [place] y [facility]. Usar la Entrada C del hospital.

## 2022-02-23 NOTE — Patient Instructions (Signed)
Precauciones de devolución relacionadas con el embarazo ° °Los siguientes son signos/síntomas que son anormales en el embarazo y pueden requerir una evaluación adicional por parte de un médico: °Vaya a MAU en [facility] [place] si: °Tiene calambres/contracciones que no desaparecen con agua potable, especialmente si duran de 30 segundos a 1,5 minutos, aparecen y desaparecen cada 5-10 minutos durante una hora o más, o si son cada vez más fuertes y no puede caminar o hablar mientras tener una contracción/calambre. °Tu agua se rompe. A veces es un gran chorro de líquido, a veces es solo un goteo que sigue mojando tu ropa interior o corriendo por tus piernas. °Tiene sangrado vaginal. °No siente que su bebé se mueva normalmente. Si no lo hace, busque algo para comer y beber (algo frío o algo con azúcar como mantequilla de maní o jugo) y acuéstese y concéntrese en sentir cómo se mueve su bebé. Si su bebé todavía no se mueve con normalidad, debe ir a MAU. Debe sentir que su bebé se mueve 6 veces en una hora o 10 veces en dos horas. °Tiene un dolor de cabeza persistente que no desaparece con 1 g de Tylenol, cambios en la visión, dolor en el pecho, dificultad para respirar, dolor intenso en la parte superior derecha del abdomen, empeoramiento de la hinchazón de las piernas; todos estos pueden ser signos de presión arterial alta en el embarazo y necesita para ser evaluado por un proveedor inmediatamente ° °Todos estos son preocupantes durante el embarazo y, si tiene alguno de estos, le recomiendo que llame a su PCP y se presente a la Unidad de Admisiones de Maternidad (mapa a continuación) para una evaluación adicional. ° °Para cualquier emergencia relacionada con el embarazo, diríjase a la Unidad de Admisiones de Maternidad en el [facility] en el [facility]. Usará la Entrada C del hospital. ° °  °

## 2022-02-23 NOTE — Progress Notes (Signed)
[facility] ?Faculty OB Clinic Visit ? ?Geriann Lafont is a 31 y.o. G3P2002 at [redacted]w[redacted]d (via LMP) who presents to [facility] for OB visit. Prenatal course, history, notes, ultrasounds, and laboratory results reviewed. ? ?Denies fluid leaking, vaginal bleeding, or decreased fetal movement. Notes irregular contractions, a few times a day, no more than once per hour. Taking PNV.   ? ?Primary Prenatal Care Provider: [name] ? ?Postpartum Plans: ?- delivery planning: SVD, unsure for pain control might like epidural (last delivery happened quickly and she was unable to get an epidural) ?- circumcision: N/a female ?- feeding: breast ?- pediatrician: [name] ?- contraception: condoms ? ?Vitals:  ? 02/25/22 0857  ?BP: 106/61  ?Pulse: 61  ? ? ? ?FHR: 134 bpm ?Uterine size: 30 cm ? ?Assessment & Plan ? ?1. Routine prenatal care: ?- 3 hr GTT done today to screen for GDM ?- pap done 12/25/20 NILM ?- discussed flu, COVID vaccines and she declines at this time, counseled ?- Tdap vaccine HD letter given ? ?2. Failed early 1 hr GTT, passed 3 hr GTT- repeat 3 hr GTT today. ? ?Next prenatal visit in 2 weeks . Labor & fetal movement precautions discussed. ? ?Burley Saver, MD ?[facility] Faculty  ? ?

## 2022-02-25 ENCOUNTER — Other Ambulatory Visit: Payer: Self-pay

## 2022-02-25 ENCOUNTER — Ambulatory Visit (INDEPENDENT_AMBULATORY_CARE_PROVIDER_SITE_OTHER): Payer: Self-pay | Admitting: Family Medicine

## 2022-02-25 VITALS — BP 106/61 | HR 61 | Wt 158.0 lb

## 2022-02-25 DIAGNOSIS — Z3493 Encounter for supervision of normal pregnancy, unspecified, third trimester: Secondary | ICD-10-CM

## 2022-02-25 LAB — POCT CBG (FASTING - GLUCOSE)-MANUAL ENTRY: Glucose Fasting, POC: 97 mg/dL (ref 70–99)

## 2022-02-26 LAB — CBC
Hematocrit: 33.9 % — ABNORMAL LOW (ref 34.0–46.6)
Hemoglobin: 11.8 g/dL (ref 11.1–15.9)
MCH: 31.8 pg (ref 26.6–33.0)
MCHC: 34.8 g/dL (ref 31.5–35.7)
MCV: 91 fL (ref 79–97)
Platelets: 223 10*3/uL (ref 150–450)
RBC: 3.71 x10E6/uL — ABNORMAL LOW (ref 3.77–5.28)
RDW: 12.5 % (ref 11.7–15.4)
WBC: 10.4 10*3/uL (ref 3.4–10.8)

## 2022-02-26 LAB — GESTATIONAL GLUCOSE TOLERANCE
Glucose, Fasting: 80 mg/dL (ref 70–94)
Glucose, GTT - 1 Hour: 162 mg/dL (ref 70–179)
Glucose, GTT - 2 Hour: 135 mg/dL (ref 70–154)
Glucose, GTT - 3 Hour: 110 mg/dL (ref 70–139)

## 2022-02-26 LAB — HIV ANTIBODY (ROUTINE TESTING W REFLEX): HIV Screen 4th Generation wRfx: NONREACTIVE

## 2022-02-26 LAB — RPR: RPR Ser Ql: NONREACTIVE

## 2022-03-08 ENCOUNTER — Other Ambulatory Visit: Payer: Self-pay

## 2022-03-08 ENCOUNTER — Ambulatory Visit (INDEPENDENT_AMBULATORY_CARE_PROVIDER_SITE_OTHER): Payer: Self-pay | Admitting: Family Medicine

## 2022-03-08 VITALS — BP 105/63 | HR 88 | Wt 157.2 lb

## 2022-03-08 DIAGNOSIS — Z3493 Encounter for supervision of normal pregnancy, unspecified, third trimester: Secondary | ICD-10-CM

## 2022-03-08 NOTE — Progress Notes (Signed)
Michele Stephens is a 31 y.o. G3P2002 at [redacted]w[redacted]d for routine follow up.  Michele Stephens reports Michele Stephens is doing well and has no concerns. ?See flow sheet for details. ? ?A/P: Pregnancy at [redacted]w[redacted]d.  Doing well.   ?Pregnancy issues include None ? ?Infant feeding choice Both ?Contraception choice pills ?Infant circumcision desired no ? ?Tdap was not given today. [facility] ?GBS/GC/CZ testing was not performed today. ? ?Preterm labor precautions reviewed. ?Safe sleep discussed. ?Kick counts reviewed. ?Follow up 2 weeks.  ? ?Next visit check Tdap status ?Schedule Faculty OB visit ? ?

## 2022-03-08 NOTE — Patient Instructions (Signed)
Thank you for coming to see me today. It was a pleasure.  ? ? ? ?Please follow-up with PCP in 2 weeks ? ?If you have any questions or concerns, please do not hesitate to call the office at [phone]. ? ?Best,  ? ?Carollee Leitz, MD   ? ?For any pregnancy-related emergencies after 5:30 pm please go to the Maternity Admissions Unit in the [place] at [place] will use hospital Entrance C. ? ?  ?

## 2022-03-11 ENCOUNTER — Ambulatory Visit: Payer: No Typology Code available for payment source | Admitting: Family Medicine

## 2022-03-14 ENCOUNTER — Encounter: Payer: Self-pay | Admitting: Family Medicine

## 2022-03-21 ENCOUNTER — Ambulatory Visit (INDEPENDENT_AMBULATORY_CARE_PROVIDER_SITE_OTHER): Payer: Self-pay | Admitting: Family Medicine

## 2022-03-21 ENCOUNTER — Encounter: Payer: Self-pay | Admitting: Family Medicine

## 2022-03-21 VITALS — BP 103/63 | HR 92 | Wt 161.2 lb

## 2022-03-21 DIAGNOSIS — Z3493 Encounter for supervision of normal pregnancy, unspecified, third trimester: Secondary | ICD-10-CM

## 2022-03-21 NOTE — Patient Instructions (Addendum)
Tercer trimestre de embarazo ?Third Trimester of Pregnancy ?El tercer trimestre de embarazo va desde la semana 28 hasta la semana 40. Esto corresponde a los meses 7 a 9. El tercer trimestre es un per?odo en el que el beb? en gestaci?n (feto) crece r?pidamente. Hacia el final del noveno mes, el feto mide alrededor de 20?pulgadas (45?cm) de largo y pesa entre 6 y 10 libras (2.7 y 4.5?kg). ?Cambios en el cuerpo durante el tercer trimestre ?Durante el tercer trimestre, su cuerpo contin?a experimentando numerosos cambios. Los cambios var?an y generalmente vuelven a la normalidad despu?s del nacimiento del beb?. ?Cambios f?sicos ?Seguir? aumentando de peso. Es de esperar que aumente entre 25 y 35?libras (11 y 16?kg) hacia el final del embarazo si inicia el embarazo con un peso normal. Si tiene bajo peso, es de esperar que aumente entre 28 y 40 libras (13 y?18 kg), y si tiene sobrepeso, es de esperar que aumente entre 15 y 25 libras (7 y 11?kg). ?Podr?n aparecer las primeras estr?as en las caderas, el abdomen y las mamas. ?Las mamas seguir?n creciendo y pueden doler. Un l?quido amarillo (calostro) puede salir de sus pechos. Esta es la primera leche que usted produce para su beb?. ?Tal vez haya cambios en el cabello. Esto cambios pueden incluir su engrosamiento, crecimiento r?pido y cambios en la textura. A algunas personas tambi?n se les cae el cabello durante o despu?s del embarazo, o tienen el cabello seco o fino. ?El ombligo puede salir hacia afuera. ?Puede observar que se le hinchan las manos, el rostro o los tobillos. ?Cambios en la salud ?Es posible que tenga acidez estomacal. ?Puede sufrir estre?imiento. ?Puede desarrollar hemorroides. ?Puede desarrollar venas hinchadas y abultadas (venas varicosas) en las piernas. ?Puede presentar m?s dolor en la pelvis, la espalda o los muslos. Esto se debe al aumento de peso y al aumento de las hormonas que relajan las articulaciones. ?Puede presentar un aumento del hormigueo o  entumecimiento en las manos, brazos y piernas. La piel de su abdomen tambi?n puede sentirse entumecida. ?Puede sentir que le falta el aire debido a que se expande el ?tero. ?Otros cambios ?Puede tener necesidad de orinar con m?s frecuencia porque el feto baja hacia la pelvis y ejerce presi?n sobre la vejiga. ?Puede tener m?s problemas para dormir. Esto puede deberse al tama?o de su abdomen, una mayor necesidad de orinar y un aumento en el metabolismo de su cuerpo. ?Puede notar que el feto ?baja? o lo siente m?s bajo, en el abdomen (aligeramiento). ?Puede tener un aumento de la secreci?n vaginal. ?Puede notar que tiene dolor alrededor del hueso p?lvico a medida que el ?tero se distiende. ?Siga estas instrucciones en su casa: ?Medicamentos ?Siga las instrucciones del m?dico en relaci?n con el uso de medicamentos. Durante el embarazo, hay medicamentos que pueden tomarse y otros que no. No tome ning?n medicamento a menos que lo haya autorizado el m?dico. ?Tome vitaminas prenatales que contengan por lo menos 600?microgramos (mcg) de ?cido f?lico. ?Comida y bebida ?Lleve una dieta saludable que incluya frutas y verduras frescas, cereales integrales, buenas fuentes de prote?nas como carnes magras, huevos o tofu, y productos l?cteos descremados. ?Evite la carne cruda y el jugo, la leche y el queso sin pasteurizar. Estos portan g?rmenes que pueden provocar da?o tanto a usted como al beb?. ?Tome 4 o 5 comidas peque?as en lugar de 3 comidas abundantes al d?a. ?Es posible que tenga que tomar estas medidas para prevenir o tratar el estre?imiento: ?Beber suficiente l?quido como para mantener la orina   de color amarillo p?lido. ?Consumir alimentos ricos en fibra, como frijoles, cereales integrales, y frutas y verduras frescas. ?Limitar el consumo de alimentos ricos en grasa y az?cares procesados, como los alimentos fritos o dulces. ?Actividad ?Haga ejercicio solamente como se lo haya indicado el m?dico. La mayor?a de las personas  pueden continuar su actividad f?sica habitual durante el embarazo. Intente realizar como m?nimo 30?minutos de actividad f?sica por lo menos 5?d?as a la semana. Deje de hacer ejercicio si experimenta contracciones en el ?tero. ?Deje de hacer ejercicio si le aparecen dolor o c?licos en la parte baja del vientre o de la espalda. ?Evite levantar pesos excesivos. ?No haga ejercicio si hace mucho calor o humedad, o si se encuentra a una altitud elevada. ?Si lo desea, puede seguir teniendo relaciones sexuales, salvo que el m?dico le indique lo contrario. ?Alivio del dolor y del malestar ?Haga pausas frecuentes y descanse con las piernas levantadas (elevadas) si tiene calambres en las piernas o dolor en la parte baja de la espalda. ?Dese ba?os de asiento con agua tibia para aliviar el dolor o las molestias causadas por las hemorroides. Use una crema para las hemorroides si el m?dico la autoriza. ?Use un sujetador que le brinde buen soporte para prevenir las molestias causadas por la sensibilidad en las mamas. ?Si tiene venas varicosas: ?Use medias de compresi?n como se lo haya indicado el m?dico. ?Eleve los pies durante 15?minutos, 3 o 4?veces por d?a. ?Limite el consumo de sal en su dieta. ?Seguridad ?Hable con su m?dico antes de viajar distancias largas. ?No se d? ba?os de inmersi?n en agua caliente, ba?os turcos ni saunas. ?Use el cintur?n de seguridad en todo momento mientras conduce o va en auto. ?Hable con el m?dico si es v?ctima de maltrato verbal o f?sico. ?Preparaci?n para el nacimiento ?Para prepararse para la llegada de su beb?: ?Tome clases prenatales para entender, practicar, y hacer preguntas sobre el trabajo de parto y el parto. ?Visite el hospital y recorra el ?rea de maternidad. ?Compre un asiento de seguridad orientado hacia atr?s, y aseg?rese de saber c?mo instalarlo en su autom?vil. ?Prepare la habitaci?n o el lugar donde dormir? el beb?. Aseg?rese de quitar todas las almohadas y animales de peluche de  la cuna del beb? para evitar la asfixia. ?Indicaciones generales ?Evite el contacto con las bandejas sanitarias de los gatos y la tierra que estos animales usan. Estos alimentos contienen bacterias que pueden causar defectos cong?nitos en el beb?. Si tiene un gato, p?dale a alguien que limpie la caja de arena por usted. ?No se haga lavados vaginales ni use tampones. No use toallas higi?nicas perfumadas. ?No consuma ning?n producto que contenga nicotina o tabaco, como cigarrillos, cigarrillos electr?nicos y tabaco de mascar. Si necesita ayuda para dejar de consumir estos productos, consulte al m?dico. ?No use ning?n remedio a base de hierbas, drogas ilegales o medicamentos que no le hayan sido recetados. Las sustancias qu?micas de estos productos pueden da?ar al beb?. ?No beba alcohol. ?Le realizar?n ex?menes prenatales m?s frecuentes durante el tercer trimestre. Durante una visita prenatal de rutina, el m?dico le har? un examen f?sico, le realizar? pruebas y hablar? con usted de su salud general. Cumpla con todas las visitas de seguimiento. Esto es importante. ?D?nde buscar m?s informaci?n ?American Pregnancy Association (Asociaci?n Estadounidense del Embarazo): americanpregnancy.org ?American College of Obstetricians and Gynecologists (Colegio Estadounidense de Obstetras y Ginec?logos): acog.org/womens-health/pregnancy? ?Office on Women's Health (Oficina para la Salud de la Mujer): womenshealth.gov/pregnancy ?Comun?quese con un m?dico si tiene: ?Fiebre. ?C?licos leves en la   pelvis, presi?n en la pelvis o dolor persistente en la zona abdominal o la parte baja de la espalda. ?V?mitos o diarrea. ?Secreci?n vaginal con mal olor u orina con mal olor. ?Dolor al orinar. ?Un dolor de cabeza que no desaparece despu?s de tomar analg?sicos. ?Cambios en la visi?n o ve manchas delante de los ojos. ?Solicite ayuda de inmediato si: ?Rompe la bolsa. ?Tiene contracciones regulares separadas por menos de 5?minutos. ?Tiene sangrado o  peque?as p?rdidas vaginales. ?Siente un dolor abdominal intenso. ?Tiene dificultad para respirar. ?Siente dolor en el pecho. ?Sufre episodios de desmayo. ?No ha sentido a su beb? moverse durante el per?

## 2022-03-21 NOTE — Progress Notes (Signed)
Michele Stephens is a 31 y.o. G3P2002 at [redacted]w[redacted]d here for routine follow up. She is dated by LMP.  She reports  intermittent contractions daily lasting seconds, seems to be slowing down.  Denies any vaginal bleeding, discharge or leaking of fluid . See flow sheet for details. ? ?A/P: Pregnancy at [redacted]w[redacted]d.  Doing well.   ?Dating reviewed, dating tab is correct ?Fetal heart tones: Appropriate ?Fundal height: within expected range.  ?Pregnancy issues include None. ?Problem list updated: Yes ?The patient does not have a history of HSV and valacyclovir is not recommended at this time.  ?The patient does not have a history of Cesarean delivery and no referral to [facility] is indicated ? ?Infant feeding choice: Both  ?Contraception choice: Pills  ?Infant circumcision desired not applicable, girl ? ?Influenza vaccine declined ?Tdap was given 03/14/2022 ? ?Childbirth and education classes were offered. Patient declined. ?Pregnancy education regarding benefits of breastfeeding, contraception, fetal growth, expected weight gain, and safe infant sleep were discussed.  ?Preterm labor and fetal movement precautions reviewed. ?Previously seen in [place] clinic in third trimester on 03/17 at [redacted]w[redacted]d.  ? ?Follow up 2 weeks. Scheduled with Dr Berna Spare April 25 at 150 pm ? ?

## 2022-04-05 ENCOUNTER — Ambulatory Visit (INDEPENDENT_AMBULATORY_CARE_PROVIDER_SITE_OTHER): Payer: Self-pay | Admitting: Family Medicine

## 2022-04-05 DIAGNOSIS — Z3493 Encounter for supervision of normal pregnancy, unspecified, third trimester: Secondary | ICD-10-CM

## 2022-04-05 NOTE — Patient Instructions (Signed)
It was wonderful seeing you today!  Your pregnancy is going great and I have no major concerns.  You will need to be seen in a week and a day for your next visit and will have a pelvic exam done at that visit.  If you have any bleeding, gush of fluid, decreased baby movement, or regular contractions between now and then please be evaluated at the maternal assessment unit.  If you have any questions or concerns call clinic.  I hope you have a great day! ? ?The website for childbirth classes is CellTraders.no. ? ?

## 2022-04-05 NOTE — Progress Notes (Signed)
?  [place] Prenatal Visit ? ?Michele Stephens is a 31 y.o. G3P2002 at [redacted]w[redacted]d here for routine follow up. She is dated by LMP.  She reports no bleeding, no contractions, no cramping, and no leaking.  She reports fetal movement. She denies vaginal bleeding, contractions, or loss of fluid.  See flow sheet for details. ? ?There were no vitals filed for this visit. ? ? ?A/P: Pregnancy at [redacted]w[redacted]d.  Doing well.   ?Routine prenatal care:  ?Dating reviewed, dating tab is correct ?Fetal heart tones: Appropriate 145 ?Fundal height: within expected range. 35 cm ?The patient does not have a history of HSV and valacyclovir is not indicated at this time.  ?The patient does not have a history of Cesarean delivery and no referral to [facility] is indicated ?Infant feeding choice: Both  ?Contraception choice: Pills  ?Infant circumcision desired not applicable ?Influenza vaccine not administered as not influenza season.   ?Tdap was not given today. Previously given  ?COVID vaccination was discussed and patient is not interested at this time.  ?Childbirth and education classes were offered.  Provided patient with information on how to schedule these. ?Pregnancy education regarding benefits of breastfeeding, contraception, fetal growth, expected weight gain, and safe infant sleep were discussed.  ?Preterm labor and fetal movement precautions reviewed. ? ? ?2. Pregnancy issues include the following and were addressed as appropriate today: ? Discussed COVID-vaccine and patient declined at this time ?Failed 1 hour GTT Early, passed 3-hour GTT, repeat 3-hour GTT passed ?Patient scheduled for next visit/5.  Will need GC/chlamydia as well as GBS collected at that visit.  Discussed this with the patient. ?Kick counts reviewed and discussed MAU precautions ? Problem list and pregnancy box updated: Yes.  ? ?Follow up 1-2 weeks.  ?

## 2022-04-15 ENCOUNTER — Other Ambulatory Visit (HOSPITAL_COMMUNITY)
Admission: RE | Admit: 2022-04-15 | Discharge: 2022-04-15 | Disposition: A | Discharge status: Home / Self Care | Payer: No Typology Code available for payment source | Source: Ambulatory Visit | Attending: Family Medicine | Admitting: Family Medicine

## 2022-04-15 ENCOUNTER — Ambulatory Visit (INDEPENDENT_AMBULATORY_CARE_PROVIDER_SITE_OTHER): Payer: Self-pay | Admitting: Family Medicine

## 2022-04-15 VITALS — BP 100/60 | HR 93 | Wt 165.6 lb

## 2022-04-15 DIAGNOSIS — Z3493 Encounter for supervision of normal pregnancy, unspecified, third trimester: Secondary | ICD-10-CM | POA: Insufficient documentation

## 2022-04-15 MED ORDER — OLOPATADINE HCL 0.1 % OP SOLN: 1.0000 [drp] | Freq: Two times a day (BID) | OPHTHALMIC | 12 refills | Status: DC

## 2022-04-15 NOTE — Progress Notes (Signed)
  [facility] Prenatal Visit  Michele Stephens is a 31 y.o. G3P2002 at [redacted]w[redacted]d here for routine follow up. She is dated by LMP.  She reports no bleeding, no contractions, no cramping, and no leaking. She reports fetal movement. She denies vaginal bleeding, contractions, or loss of fluid.  Patient does report she has been having issues with conjunctivitis which started approximately 10 days ago.  She was seen in an urgent care 6 days ago and given erythromycin ointment.  She reports that the redness and swelling in her eyes decreased after 4 days and so she discontinued but it returned.  See flow sheet for details.  Vitals:   04/15/22 1553  BP: 100/60  Pulse: 93    A/P: Pregnancy at [redacted]w[redacted]d.  Doing well.   Routine prenatal care  Dating reviewed, dating tab is correct Fetal heart tones Appropriate Fundal height within expected range.  Fetal position confirmed Vertex using Ultrasound .  GBS collected today. .  Repeat GC/CT collected today.  The patient does not have a history of HSV and valacyclovir is not indicated at this time.  Infant feeding choice: Both  Contraception choice: Pills  Infant circumcision desired not applicable Influenza vaccine not administered as not influenza season.   Tdap previously administered between 27-36 weeks  COVID vaccination was discussed and patient declines at this time.  Pregnancy education regarding preterm labor, fetal movement,  benefits of breastfeeding, contraception, fetal growth, expected weight gain, and safe infant sleep were discussed.    2. Pregnancy issues include the following and were addressed as appropriate today:  Patient with notable conjunctivitis.  Has been present for 10 days.  Has discharge in the morning but resolves throughout the day.  This could be viral or could also be allergic in nature.  Prescription sent for Pataday eyedrops.  May require further antibiotic treatment although patient has already completed  antibiotic regimen.  Strict return precautions given Discussed COVID-vaccine and patient declines at this time Failed early 1 hour GTT but passed 3-hour GTT, repeat 3-hour GTT passed GC/chlamydia collected today as well as GBS, will discuss at next visit Discussed kick counts and MAU precautions Follow-up in 1 week  Problem list and pregnancy box updated: Yes.  Follow up 1 week.

## 2022-04-15 NOTE — Patient Instructions (Addendum)
It was wonderful seeing you today.  I am glad you are doing so well.  Your baby looks great and I have no concerns.  She is headdown at this time which is wonderful.  We collected repeat STD screening and a group B strep test.  These results will come back and we can discuss them at your next visit.  If you have any vaginal bleeding, gush of fluid, feel decreased fetal movement, or feel regular contractions between now and your next visit please be evaluated at the maternal assessment unit in the bottom of the [facility].  Regarding her eyes I believe that this is most likely a viral infection and should improve on its own.  If it gets worse I would like you reevaluated.  If it starts affecting your vision I would like you to be reevaluated.  I hope you have a wonderful afternoon! ? ? ?

## 2022-04-19 LAB — CULTURE, BETA STREP (GROUP B ONLY): Strep Gp B Culture: NEGATIVE

## 2022-04-19 LAB — CERVICOVAGINAL ANCILLARY ONLY
Chlamydia: NEGATIVE
Comment: NEGATIVE
Comment: NORMAL
Neisseria Gonorrhea: NEGATIVE

## 2022-04-22 ENCOUNTER — Ambulatory Visit (INDEPENDENT_AMBULATORY_CARE_PROVIDER_SITE_OTHER): Payer: Self-pay | Admitting: Family Medicine

## 2022-04-22 DIAGNOSIS — Z3493 Encounter for supervision of normal pregnancy, unspecified, third trimester: Secondary | ICD-10-CM

## 2022-04-22 NOTE — Progress Notes (Signed)
?  [facility] Prenatal Visit ? ?[name] is a 31 y.o. G3P2002 at [redacted]w[redacted]d here for routine follow up. She is dated by LMP.  She reports no bleeding, no cramping, no leaking, and occasional contractions. She reports fetal movement. She denies vaginal bleeding, contractions, or loss of fluid. See flow sheet for details. ? ?Vitals:  ? 04/22/22 1535  ?BP: 102/65  ?Pulse: 86  ? ? ?A/P: Pregnancy at [redacted]w[redacted]d.  Doing well.   ?Routine prenatal care  ?Dating reviewed, dating tab is correct ?Fetal heart tones Appropriate 130 bpm ?Fundal height within expected range.  ?Fetal position confirmed Vertex using Ultrasound .  ?GBS negative on 36 weeks swab ?Repeat GC/CT collected at previous visit.  Negative results ?The patient does not have a history of HSV and valacyclovir is not indicated at this time.  ?Infant feeding choice: Both  ?Contraception choice: Pills  ?Infant circumcision desired not applicable ?Influenza vaccine not administered as not influenza season.   ?Tdap previously administered between 27-36 weeks  ?COVID vaccination was discussed and patient declines at this time.Marland Kitchen  ?Pregnancy education regarding preterm labor, fetal movement,  benefits of breastfeeding, contraception, fetal growth, expected weight gain, and safe infant sleep were discussed.  ? ? ?2. Pregnancy issues include the following and were addressed as appropriate today: ? ?Conjunctivitis has resolved since previous visit.  Did not require any treatment. ?Failed 1 hour GTT but passed 3-hour GTT.  Repeat 3-hour GTT was passed. ?GC/chlamydia and GBS negative from last visit ?Discussed strict MAU precautions as well as kick counts ?Follow-up in 1 week ? Problem list and pregnancy box updated: Yes.  ? ?Follow up 1 week.   ?

## 2022-04-22 NOTE — Patient Instructions (Signed)
It was good seeing you today.  Your baby is doing well and I have no concerns.  We have scheduled your next appointment for the 23rd of this month.  At that visit we will schedule your induction.  If you have any bleeding, gush of fluid, contractions between now and then please be evaluated the maternal assessment unit.  If you have any questions call our clinic.  I hope you have a wonderful day! ? ?Fue bueno verte hoy. Tu beb? est? bien y no tengo preocupaciones. Hemos programado su pr?xima cita para el d?a 23 de este mes. En esa visita programaremos su inducci?n. Si presenta alg?n sangrado, chorro de l?quido, contracciones entre un momento y otro por favor sea evaluada en la unidad de evaluaci?n materna. Si tienes [place] duda llama a nuestra cl?nica. ?Espero que tengas un maravilloso d?a!  ?

## 2022-05-03 ENCOUNTER — Ambulatory Visit (INDEPENDENT_AMBULATORY_CARE_PROVIDER_SITE_OTHER): Payer: Self-pay | Admitting: Family Medicine

## 2022-05-03 VITALS — BP 101/72 | HR 100 | Wt 170.2 lb

## 2022-05-03 DIAGNOSIS — Z3493 Encounter for supervision of normal pregnancy, unspecified, third trimester: Secondary | ICD-10-CM

## 2022-05-03 NOTE — Progress Notes (Signed)
  [facility] Prenatal Visit  Michele Stephens is a 31 y.o. G3P2002 at [redacted]w[redacted]d here for routine follow up. She is dated by LMP.  She reports no bleeding, no cramping, no leaking, and occasional contractions. She reports fetal movement. She denies vaginal bleeding, contractions, or loss of fluid. See flow sheet for details.  Vitals:   05/03/22 0853  BP: 101/72  Pulse: 100    A/P: Pregnancy at [redacted]w[redacted]d.  Doing well.   Routine prenatal care:  Dating reviewed, dating tab is correct Fetal heart tones Appropriate 150 Fundal height within expected range.  39 cm Fetal position confirmed Vertex using Ultrasound .  Infant feeding choice: Both  Contraception choice: Pills  Infant circumcision desired not applicable Pain control in labor discussed and patient desires natural birth.  Influenza vaccine not administered as not influenza season.   Tdap previously administered between 27-36 weeks  GBS and gc/chlamydia testing results were reviewed today.   Pregnancy education regarding labor, fetal movement,  benefits of breastfeeding, contraception, and safe infant sleep were discussed.  Labor and fetal movement precautions reviewed. Induction of labor discussed. Scheduled for induction at approximately 41 weeks. BPP scheduled between 40-41 weeks.  2. Pregnancy issues include the following and were addressed as appropriate today:   Failed 1 hour GTT but passed 3-hour GTT.  Repeat GTT was within normal limits GC/chlamydia as well as GBS negative Scheduled induction for 41 weeks Scheduled BPP between 40 and 41 weeks Discussed strict MAU precautions as well as kick counts Follow-up in 1 week  Problem list and pregnancy box updated: Yes.   Follow up 1 week.

## 2022-05-03 NOTE — Patient Instructions (Addendum)
It was wonderful seeing you today!  Your baby looks great and I have no concerns.  We have scheduled your induction for 05/18/2022.  Someone will call you to let you know when they come in.  I do not believe you are going to make it to your induction and believe you will likely go into labor in the next week or so.  If you start having regular contractions, feel like your baby is not moving well, have a gush of fluid or any vaginal bleeding please be evaluated in the maternal assessment unit.  We also scheduled you for an ultrasound to be done on 05/13/2022 at [facility] for women on [address] in [place].  You are scheduled to see me next week 5/30 at 8:50 AM.  If you have any issues between now and then please let us know.  I hope you have a wonderful day!  Fue maravilloso verte hoy! Tu beb se ve muy bien y no tengo preocupaciones. Hemos programado su induccin para el 06/16/2022. Alguien la llamar para avisarle cuando lleguen. No creo que vaya a llegar a su induccin y creo que probablemente se pondr de parto en la prxima semana ms o menos. Si comienza a Magazine features editor, siente que su beb no se mueve bien, tiene un chorro de lquido o algn sangrado vaginal, por favor sea evaluada en la unidad de evaluacin materna. Tambin le programamos una ecografa para el 2 de junio de 2023 en el [facility] [place] en [place]. Est programado para verme la prxima semana el 30/5 a las 8:50 a.m. Si tiene algn problema entre ahora y entonces, hganoslo saber. Espero que tengas un [place]!

## 2022-05-10 ENCOUNTER — Ambulatory Visit (INDEPENDENT_AMBULATORY_CARE_PROVIDER_SITE_OTHER): Payer: Self-pay | Admitting: Family Medicine

## 2022-05-10 DIAGNOSIS — Z3A39 39 weeks gestation of pregnancy: Secondary | ICD-10-CM

## 2022-05-10 DIAGNOSIS — Z3493 Encounter for supervision of normal pregnancy, unspecified, third trimester: Secondary | ICD-10-CM

## 2022-05-10 NOTE — Progress Notes (Signed)
  [facility] Prenatal Visit  Michele Stephens is a 31 y.o. G3P2002 at [redacted]w[redacted]d here for routine follow up. She is dated by LMP.  She reports no bleeding, no cramping, no leaking, and occasional contractions. She reports fetal movement. She denies vaginal bleeding, contractions, or loss of fluid. See flow sheet for details.  There were no vitals filed for this visit.  A/P: Pregnancy at [redacted]w[redacted]d.  Doing well.   Routine prenatal care:  Dating reviewed, dating tab is correct Fetal heart tones Appropriate 130 Fundal height within expected range.  40 cm Fetal position confirmed Vertex using Ultrasound .  Cervical exam 4/50/-2 Infant feeding choice: Both  Contraception choice: Pills  Infant circumcision desired not applicable Pain control in labor discussed and patient desires IV pain medicines and possible nitrous.  Is not interested in epidural at this time.  Influenza vaccine not administered as not influenza season.   Tdap previously administered between 27-36 weeks  GBS and gc/chlamydia testing results were reviewed today.   Pregnancy education regarding labor, fetal movement,  benefits of breastfeeding, contraception, and safe infant sleep were discussed.  Labor and fetal movement precautions reviewed. Induction of labor discussed. Scheduled for induction at approximately 41 weeks. BPP scheduled between 40-41 weeks.   2. Pregnancy issues include the following and were addressed as appropriate today:  Patient failed 1 hour GTT but passed 3-hour GTT.  Repeat GTT within normal limits GBS negative Patient has been scheduled for induction at 41 weeks BPP scheduled between 40 and 41 weeks Discussed strict return precautions as well as kick counts Cervical exam today 4/50/-2  Problem list and pregnancy box updated: Yes.   Follow up 1 week.

## 2022-05-10 NOTE — Patient Instructions (Addendum)
It was great seeing you today!  We checked your cervix and you are dilated to 4 cm.  If you start having regular contractions, gush of fluid, any vaginal bleeding, or you feel like your baby is not moving well please be evaluated at the maternal assessment unit.  I hope you have a wonderful day!  Fue genial verte hoy! Le revisamos el cuello uterino y est dilatado a 4 cm. Si comienza a Science writer, chorro de lquido, sangrado vaginal o siente que su beb no se mueve bien, Haematologist en la unidad de evaluacin materna. Espero que tengas un [employer] da!

## 2022-05-11 ENCOUNTER — Other Ambulatory Visit: Payer: Self-pay | Admitting: Advanced Practice Midwife

## 2022-05-13 ENCOUNTER — Ambulatory Visit: Payer: Medicaid Other | Attending: Family Medicine

## 2022-05-13 ENCOUNTER — Other Ambulatory Visit: Payer: Self-pay | Admitting: Advanced Practice Midwife

## 2022-05-13 DIAGNOSIS — Z3A40 40 weeks gestation of pregnancy: Secondary | ICD-10-CM | POA: Diagnosis not present

## 2022-05-13 DIAGNOSIS — O09293 Supervision of pregnancy with other poor reproductive or obstetric history, third trimester: Secondary | ICD-10-CM | POA: Insufficient documentation

## 2022-05-13 DIAGNOSIS — Z3493 Encounter for supervision of normal pregnancy, unspecified, third trimester: Secondary | ICD-10-CM

## 2022-05-13 DIAGNOSIS — O480 Post-term pregnancy: Secondary | ICD-10-CM | POA: Insufficient documentation

## 2022-05-13 DIAGNOSIS — O403XX0 Polyhydramnios, third trimester, not applicable or unspecified: Secondary | ICD-10-CM | POA: Diagnosis not present

## 2022-05-14 ENCOUNTER — Encounter (HOSPITAL_COMMUNITY): Payer: Self-pay | Admitting: Obstetrics and Gynecology

## 2022-05-14 ENCOUNTER — Other Ambulatory Visit: Payer: Self-pay

## 2022-05-14 ENCOUNTER — Inpatient Hospital Stay (HOSPITAL_COMMUNITY)
Admission: AD | Admit: 2022-05-14 | Discharge: 2022-05-14 | Disposition: A | Discharge status: Home / Self Care | Payer: Self-pay | Attending: Obstetrics and Gynecology | Admitting: Obstetrics and Gynecology

## 2022-05-14 DIAGNOSIS — O479 False labor, unspecified: Secondary | ICD-10-CM

## 2022-05-14 DIAGNOSIS — Z3A40 40 weeks gestation of pregnancy: Secondary | ICD-10-CM | POA: Insufficient documentation

## 2022-05-14 NOTE — MAU Note (Signed)
..  Michele Stephens is a 31 y.o. at [redacted]w[redacted]d here in MAU reporting: Irregular contractions and she was 4cm already so is concerned that she might be more. Denies vaginal bleeding or leaking of fluid. +FM.    Pain score: 2/10 Vitals:   05/14/22 2036  BP: 118/65  Pulse: 87  Resp: 20  Temp: 98.7 F (37.1 C)  SpO2: 100%     WYO:[id number] in room 127 Lab orders placed from triage:  none

## 2022-05-14 NOTE — MAU Note (Signed)
I have communicated with Dr. Mathis Fare and reviewed vital signs:  Vitals:   05/14/22 2036  BP: 118/65  Pulse: 87  Resp: 20  Temp: 98.7 F (37.1 C)  SpO2: 100%    Vaginal exam:  Dilation: 4 Effacement (%): 50 Station: -3 Presentation: Vertex Exam by:: Joline Salt, RN,   Also reviewed contraction pattern and that non-stress test is reactive.  It has been documented that patient is contracting irregularly and cervix was unchanged from exam on Tuesday not indicating active labor.  Patient denies any other complaints and desires to go home.  Based on this report provider has given order for discharge.  A discharge order and diagnosis entered by a provider.   Labor discharge instructions reviewed with patient.

## 2022-05-14 NOTE — MAU Provider Note (Signed)
Michele Stephens is a 31 y.o. G5P2002 female at [redacted]w[redacted]d.  RN labor check, not seen by provider.  SVE by RN: Dilation: 4 Effacement (%): 50 Station: -3 Exam by:: Michele Lesches, RN  NST: FHR baseline 125 bpm, Variability: Moderate, Accelerations: Present, Decelerations: Absent = Cat 1/Reactive Toco: Irregular, every 4-7 minutes    Assessment and Plan: - SVE unchanged compared to exam in office - Irregular contractions on tocometer - Not in active labor - Cat 1, reactive NST - Stable for discharge with return precautions  - Follow up for IOL as scheduled   Genia Del, MD  05/14/2022 8:57 PM

## 2022-05-16 ENCOUNTER — Telehealth (HOSPITAL_COMMUNITY): Payer: Self-pay | Admitting: *Deleted

## 2022-05-16 NOTE — Telephone Encounter (Signed)
Preadmission screen  Interpreter number [phone]

## 2022-05-18 ENCOUNTER — Encounter (HOSPITAL_COMMUNITY): Payer: Self-pay | Admitting: Obstetrics and Gynecology

## 2022-05-18 ENCOUNTER — Inpatient Hospital Stay (HOSPITAL_COMMUNITY): Payer: Medicaid Other

## 2022-05-18 ENCOUNTER — Inpatient Hospital Stay (HOSPITAL_COMMUNITY)
Admission: AD | Admit: 2022-05-18 | Discharge: 2022-05-20 | DRG: 807 | Disposition: A | Discharge status: Home / Self Care | Payer: Medicaid Other | Attending: Obstetrics & Gynecology | Admitting: Obstetrics & Gynecology

## 2022-05-18 ENCOUNTER — Other Ambulatory Visit: Payer: Self-pay

## 2022-05-18 DIAGNOSIS — Z3A41 41 weeks gestation of pregnancy: Secondary | ICD-10-CM

## 2022-05-18 DIAGNOSIS — Z789 Other specified health status: Principal | ICD-10-CM | POA: Diagnosis present

## 2022-05-18 DIAGNOSIS — O480 Post-term pregnancy: Principal | ICD-10-CM | POA: Diagnosis present

## 2022-05-18 LAB — TYPE AND SCREEN
ABO/RH(D): O POS
Antibody Screen: NEGATIVE

## 2022-05-18 LAB — CBC
HCT: 40.3 % (ref 36.0–46.0)
Hemoglobin: 14 g/dL (ref 12.0–15.0)
MCH: 32.3 pg (ref 26.0–34.0)
MCHC: 34.7 g/dL (ref 30.0–36.0)
MCV: 92.9 fL (ref 80.0–100.0)
Platelets: 222 10*3/uL (ref 150–400)
RBC: 4.34 MIL/uL (ref 3.87–5.11)
RDW: 13.2 % (ref 11.5–15.5)
WBC: 7.9 10*3/uL (ref 4.0–10.5)
nRBC: 0 % (ref 0.0–0.2)

## 2022-05-18 MED ORDER — WITCH HAZEL-GLYCERIN EX PADS: 1.0000 "application " | MEDICATED_PAD | CUTANEOUS | Status: DC | PRN

## 2022-05-18 MED ORDER — ONDANSETRON HCL 4 MG PO TABS: 4.0000 mg | ORAL_TABLET | ORAL | Status: DC | PRN

## 2022-05-18 MED ORDER — ERYTHROMYCIN 5 MG/GM OP OINT
TOPICAL_OINTMENT | OPHTHALMIC | Status: AC
  Filled 2022-05-18: qty 1

## 2022-05-18 MED ORDER — SOD CITRATE-CITRIC ACID 500-334 MG/5ML PO SOLN: 30.0000 mL | ORAL | Status: DC | PRN

## 2022-05-18 MED ORDER — DIBUCAINE (PERIANAL) 1 % EX OINT
1.0000 | TOPICAL_OINTMENT | CUTANEOUS | Status: DC | PRN
Start: 2022-05-18 — End: 2022-05-20

## 2022-05-18 MED ORDER — ONDANSETRON HCL 4 MG/2ML IJ SOLN: 4.0000 mg | INTRAMUSCULAR | Status: DC | PRN

## 2022-05-18 MED ORDER — OXYTOCIN-SODIUM CHLORIDE 30-0.9 UT/500ML-% IV SOLN
1.0000 m[IU]/min | INTRAVENOUS | Status: DC
  Administered 2022-05-18: 2 m[IU]/min via INTRAVENOUS
  Filled 2022-05-18: qty 500

## 2022-05-18 MED ORDER — SIMETHICONE 80 MG PO CHEW: 80.0000 mg | CHEWABLE_TABLET | ORAL | Status: DC | PRN

## 2022-05-18 MED ORDER — OXYTOCIN BOLUS FROM INFUSION
333.0000 mL | Freq: Once | INTRAVENOUS | Status: AC
  Administered 2022-05-18: 333 mL via INTRAVENOUS

## 2022-05-18 MED ORDER — SENNOSIDES-DOCUSATE SODIUM 8.6-50 MG PO TABS
2.0000 | ORAL_TABLET | ORAL | Status: DC
  Administered 2022-05-19: 2 via ORAL
  Filled 2022-05-18: qty 2

## 2022-05-18 MED ORDER — LIDOCAINE HCL (PF) 1 % IJ SOLN: 30.0000 mL | INTRAMUSCULAR | Status: DC | PRN

## 2022-05-18 MED ORDER — FENTANYL CITRATE (PF) 100 MCG/2ML IJ SOLN: 100.0000 ug | INTRAMUSCULAR | Status: DC | PRN

## 2022-05-18 MED ORDER — PRENATAL MULTIVITAMIN CH
1.0000 | ORAL_TABLET | Freq: Every day | ORAL | Status: DC
  Administered 2022-05-19: 1 via ORAL

## 2022-05-18 MED ORDER — OXYCODONE-ACETAMINOPHEN 5-325 MG PO TABS
1.0000 | ORAL_TABLET | ORAL | Status: DC | PRN
  Administered 2022-05-18: 1 via ORAL

## 2022-05-18 MED ORDER — IBUPROFEN 600 MG PO TABS
600.0000 mg | ORAL_TABLET | Freq: Four times a day (QID) | ORAL | Status: DC
  Administered 2022-05-19 (×2): 600 mg via ORAL
  Filled 2022-05-18 (×6): qty 1

## 2022-05-18 MED ORDER — LACTATED RINGERS IV SOLN: 500.0000 mL | INTRAVENOUS | Status: DC | PRN

## 2022-05-18 MED ORDER — OXYTOCIN-SODIUM CHLORIDE 30-0.9 UT/500ML-% IV SOLN: 2.5000 [IU]/h | INTRAVENOUS | Status: DC

## 2022-05-18 MED ORDER — DIPHENHYDRAMINE HCL 25 MG PO CAPS: 25.0000 mg | ORAL_CAPSULE | Freq: Four times a day (QID) | ORAL | Status: DC | PRN

## 2022-05-18 MED ORDER — MISOPROSTOL 25 MCG QUARTER TABLET: 25.0000 ug | ORAL_TABLET | ORAL | Status: DC | PRN

## 2022-05-18 MED ORDER — TERBUTALINE SULFATE 1 MG/ML IJ SOLN: 0.2500 mg | Freq: Once | INTRAMUSCULAR | Status: DC | PRN

## 2022-05-18 MED ORDER — ZOLPIDEM TARTRATE 5 MG PO TABS: 5.0000 mg | ORAL_TABLET | Freq: Every evening | ORAL | Status: DC | PRN

## 2022-05-18 MED ORDER — BENZOCAINE-MENTHOL 20-0.5 % EX AERO: 1.0000 "application " | INHALATION_SPRAY | CUTANEOUS | Status: DC | PRN

## 2022-05-18 MED ORDER — COCONUT OIL OIL: 1.0000 "application " | TOPICAL_OIL | Status: DC | PRN

## 2022-05-18 MED ORDER — OXYCODONE-ACETAMINOPHEN 5-325 MG PO TABS
2.0000 | ORAL_TABLET | ORAL | Status: DC | PRN
  Filled 2022-05-18: qty 2

## 2022-05-18 MED ORDER — LACTATED RINGERS IV SOLN: INTRAVENOUS | Status: DC

## 2022-05-18 MED ORDER — ONDANSETRON HCL 4 MG/2ML IJ SOLN: 4.0000 mg | Freq: Four times a day (QID) | INTRAMUSCULAR | Status: DC | PRN

## 2022-05-18 MED ORDER — TETANUS-DIPHTH-ACELL PERTUSSIS 5-2.5-18.5 LF-MCG/0.5 IM SUSY: 0.5000 mL | PREFILLED_SYRINGE | Freq: Once | INTRAMUSCULAR | Status: DC

## 2022-05-18 MED ORDER — ACETAMINOPHEN 325 MG PO TABS: 650.0000 mg | ORAL_TABLET | ORAL | Status: DC | PRN

## 2022-05-18 MED ORDER — TRANEXAMIC ACID-NACL 1000-0.7 MG/100ML-% IV SOLN
1000.0000 mg | INTRAVENOUS | Status: DC
  Filled 2022-05-18: qty 100

## 2022-05-18 NOTE — Discharge Summary (Signed)
Postpartum Discharge Summary  Date of Service updated 05/20/22     Patient Name: Michele Stephens DOB: 04-30-1991 MRN: [id number]  Date of admission: 05/18/2022 Delivery date:05/18/2022  Delivering provider: Gifford Shave  Date of discharge: 05/20/2022  Admitting diagnosis: Post term pregnancy over 40 weeks [O48.0] Intrauterine pregnancy: [redacted]w[redacted]d    Secondary diagnosis:  Principal Problem:   Post term pregnancy over 40 weeks Active Problems:   Vaginal delivery   Language barrier  Additional problems: none    Discharge diagnosis: Term Pregnancy Delivered                                              Post partum procedures: none Augmentation: AROM and Pitocin Complications: None  Hospital course: Onset of Labor With Vaginal Delivery      31y.o. yo G3P2002 at [id number] admitted in Latent Labor on 05/18/2022. She was initially scheduled for a postdates induction but was unable to be brought in until the early evening at which point her cervix was 7cm already. She had Pitocin and AROM prior to progressing to complete with uncomplicated vaginal delivery.   Membrane Rupture Time/Date: 7:26 PM ,05/18/2022   Delivery Method:Vaginal, Spontaneous  Episiotomy: None  Lacerations:  None  Patient had an uncomplicated postpartum course.  She is ambulating, tolerating a regular diet, passing flatus, and urinating well. Patient is discharged home in stable condition on 05/20/22.  Newborn Data: Birth date:05/18/2022  Birth time:9:17 PM  Gender:Female  Living status:Living  Apgars:8 ,8  Weight:3800 g (8lb 6oz)  Magnesium Sulfate received: No BMZ received: No Rhophylac:N/A MMR:N/A T-DaP: offered postpartum Flu: No Transfusion:No  Physical exam  Vitals:   05/19/22 0801 05/19/22 1216 05/19/22 1956 05/20/22 0522  BP: (!) 107/59 118/63 (!) 97/52 (!) 95/56  Pulse: 88 67 71 64  Resp: '16 16 17 16  ' Temp: 98.3 F (36.8 C) 98.5 F (36.9 C) 97.7 F (36.5 C) 97.9 F (36.6 C)  TempSrc: Oral  Oral Oral Oral  SpO2: 98% 99%     General: alert, cooperative, and no distress Lochia: appropriate Uterine Fundus: firm Incision: Dressing is clean, dry, and intact DVT Evaluation: No evidence of DVT seen on physical exam. Labs: Lab Results  Component Value Date   WBC 7.9 05/18/2022   HGB 14.0 05/18/2022   HCT 40.3 05/18/2022   MCV 92.9 05/18/2022   PLT 222 05/18/2022       No data to display         Edinburgh Score:    09/03/2019    5:37 AM  Edinburgh Postnatal Depression Scale Screening Tool  I have been able to laugh and see the funny side of things. 0  I have looked forward with enjoyment to things. 0  I have blamed myself unnecessarily when things went wrong. 0  I have been anxious or worried for no good reason. 0  I have felt scared or panicky for no good reason. 0  Things have been getting on top of me. 3  I have been so unhappy that I have had difficulty sleeping. 0  I have felt sad or miserable. 0  I have been so unhappy that I have been crying. 0  The thought of harming myself has occurred to me. 0  Edinburgh Postnatal Depression Scale Total 3     After visit meds:  Allergies as  of 05/20/2022   Not on File      Medication List     TAKE these medications    ibuprofen 600 MG tablet Commonly known as: ADVIL Take 1 tablet (600 mg total) by mouth every 6 (six) hours.   multivitamin capsule Take 1 capsule by mouth daily.   olopatadine 0.1 % ophthalmic solution Commonly known as: Pataday Place 1 drop into both eyes 2 (two) times daily.         Discharge home in stable condition Infant Feeding: Breast Infant Disposition:home with mother Discharge instruction: per After Visit Summary and Postpartum booklet. Activity: Advance as tolerated. Pelvic rest for 6 weeks.  Diet: routine diet Future Appointments:No future appointments. Follow up Visit:  [place] Follow up.   Why: You will be  contacted for a postpartum visit in 4-6 weeks. Contact information: [place]                Myrtis Ser, CNM  P [facility] Admin Please schedule this patient for Postpartum visit in: 4-6 weeks with the following provider: Gifford Shave if possible  In-Person  For C/S patients schedule nurse incision check in weeks 2 weeks: no  Low risk pregnancy complicated by: none  Delivery mode:  SVD  Anticipated Birth Control:  POPs  PP Procedures needed: none  Edinburgh: not done yet Schedule Integrated BH visit: no  No relevant baby issues    05/20/2022 Hansel Feinstein, CNM

## 2022-05-18 NOTE — H&P (Addendum)
OBSTETRIC ADMISSION HISTORY AND PHYSICAL  Michele Stephens is a 31 y.o. female [phone] with IUP at [redacted]w[redacted]d by LMP presenting for IOL for post dates. She reports +FMs, No LOF, no VB, no blurry vision, headaches or peripheral edema, and RUQ pain.  She plans on breast and formula feeding. She request POPs for birth control. She received her prenatal care at [facility]   Dating: By LMP --->  Estimated Date of Delivery: 05/11/22  Sono:    @[redacted]w[redacted]d , CWD, normal anatomy, breech presentation, posterior lie, 385g, 11.7% EFW   Prenatal History/Complications:  BPP 8/8 Polyhydramnios noted on post dates BPP AFI of 25.9  Past Medical History: Past Medical History:  Diagnosis Date   Gestational diabetes     Past Surgical History: Past Surgical History:  Procedure Laterality Date   NO PAST SURGERIES      Obstetrical History: OB History     Gravida  3   Para  2   Term  2   Preterm      AB  0   Living  2      SAB  0   IAB      Ectopic  0   Multiple  0   Live Births  2           Social History Social History   Socioeconomic History   Marital status: Significant Other    Spouse name:   Number of children: 2   Years of education: 6   Highest education level: Not on file  Occupational History   Occupation: Housewife  Tobacco Use   Smoking status: Never   Smokeless tobacco: Never  Vaping Use   Vaping Use: Never used  Substance and Sexual Activity   Alcohol use: No   Drug use: No   Sexual activity: Yes    Birth control/protection: None, Condom  Other Topics Concern   Not on file  Social History Narrative   Lives at home with her 2 daughters.   Father of children is a boyfriend who does not live with them    Social Determinants of Health   Financial Resource Strain: Not on file  Food Insecurity: Not on file  Transportation Needs: Not on file  Physical Activity: Not on file  Stress: Not on file  Social Connections: Not on file    Family  History: History reviewed. No pertinent family history.  Allergies: Not on File  Medications Prior to Admission  Medication Sig Dispense Refill Last Dose   Multiple Vitamin (MULTIVITAMIN) capsule Take 1 capsule by mouth daily.      olopatadine (PATADAY) 0.1 % ophthalmic solution Place 1 drop into both eyes 2 (two) times daily. 5 mL 12      Review of Systems   All systems reviewed and negative except as stated in HPI  Blood pressure 136/80, pulse 87, last menstrual period 08/04/2021, currently breastfeeding. General appearance: alert, cooperative, and no distress Lungs: clear to auscultation bilaterally Heart: regular rate and rhythm Abdomen: soft, non-tender; bowel sounds normal Extremities: Homans sign is negative, no sign of DVT Presentation: cephalic Fetal monitoringBaseline: 120 bpm, Variability: Good {> 6 bpm), Accelerations: Reactive, and Decelerations: Absent Uterine activityFrequency: Every 8-10 minutes Dilation: 7 Effacement (%): 80 Station: Ballotable Exam by:: cresenzo   Prenatal labs: ABO, Rh: --/--/PENDING (06/07 1740) Antibody: PENDING (06/07 1740) Rubella: 1.04 (12/02 1048) RPR: Non Reactive (03/17 0855)  HBsAg: Negative (12/02 1048)  HIV: Non Reactive (03/17 0855)  GBS: Negative/-- (05/05 1659)  1  hr Glucola failed Genetic screening  declined Anatomy US normal   Prenatal Transfer Tool  Maternal Diabetes: No Genetic Screening: Declined Maternal Ultrasounds/Referrals: Normal Fetal Ultrasounds or other Referrals:  None Maternal Substance Abuse:  No Significant Maternal Medications:  None Significant Maternal Lab Results: Group B Strep negative  Results for orders placed or performed during the hospital encounter of 05/18/22 (from the past 24 hour(s))  Type and screen   Collection Time: 05/18/22  5:40 PM  Result Value Ref Range   ABO/RH(D) PENDING    Antibody Screen PENDING    Sample Expiration      05/21/2022,2359 Performed at [facility], 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 G3P2002 at [redacted]w[redacted]d here forIOL for post dates.  #Labor: Patient presenting dilated approximately 7 cm.  Is not contracting regularly.  Will induce with Pitocin and plan for AROM when fetus is engaged with the cervix #Pain: Natural but patient may request epidural #FWB: Category 1, reassuring #ID:  GBS negative #MOF: Breast and formula #MOC: POPs  Derrel Nip, MD  05/18/2022, 6:15 PM  CNM attestation:  I have seen and examined this patient; I agree with above documentation in the resident's note.   Michele Stephens is a 58 y.o. [phone] here for IOL due to postdates, with advanced cervical change  PE: BP 121/71   Pulse 84   Temp 98.6 F (37 C)   LMP 08/04/2021 (Exact Date)  Gen: calm comfortable, NAD Resp: normal effort, no distress Abd: gravid  ROS, labs, PMH reviewed  Plan: Admit to Labor and Delivery Plan Pit/AROM Anticipate vag del  Michele Stephens CNM 05/18/2022, 7:43 PM

## 2022-05-18 NOTE — Progress Notes (Signed)
Patient ID: Michele Stephens, female   DOB: 03/18/1991, 31 y.o.   MRN: [id number]  Feeling the urge to push  BP 134/78, P 84 FHR 130s, +accels, early variables Ctx q 3 mins with Pit at 47mu/min Cx had ant lip recently  IUP@41 .0wks End 1st stage  Push w ctx Anticipate vag del  Arabella Merles CNM 05/18/2022 9:10 PM

## 2022-05-18 NOTE — Progress Notes (Signed)
Interpreter called patient for induction no answer. Will attempt later. Provider was notified of no answer.

## 2022-05-19 LAB — RPR: RPR Ser Ql: NONREACTIVE

## 2022-05-19 NOTE — Progress Notes (Signed)
Visit conducted with [employer] Spanish interpreter  Post Partum Day #1 Subjective: no complaints, up ad lib, and tolerating PO; breastfeeding going slowly- also supplementing; plans on OCPs for contraception  Objective: Blood pressure (!) 104/57, pulse 76, temperature 98.4 F (36.9 C), temperature source Oral, resp. rate 17, last menstrual period 08/04/2021, SpO2 98 %, unknown if currently breastfeeding.  Physical Exam:  General: cooperative, fatigued, and no distress Lochia: appropriate Uterine Fundus: firm DVT Evaluation: No evidence of DVT seen on physical exam.  Recent Labs    05/18/22 1740  HGB 14.0  HCT 40.3    Assessment/Plan: Plan for discharge tomorrow   LOS: 1 day   Arabella Merles CNM 05/19/2022, 6:46 AM

## 2022-05-19 NOTE — Lactation Note (Signed)
This note was copied from a baby's chart. Lactation Consultation Note  Patient Name: Michele Stephens [id number] Date: 05/19/2022 Reason for consult: Initial assessment;Term Age:31 years  Consult was done in Spanish: Initial visit to 12 hours old infant of a P3 mother. Mother states Michele Stephens is feeding well. Mother states she has breast and formula fed both of her other older children for 1 year and plans to do the same for this infant.   Reviewed normal newborn behavior during first 24h, expected output, tummy size and feeding frequency. Talked about pace bottled feeding, frequent burping and upright position. Compared formula feeding volume with and without breastfeeding. LC talked about providing a manual pump for stimulation and supplementation.   Plan: 1-Skin to skin, aim for a deep, comfortable latch and breastfeed on demand or 8-12 times in 24h period. 2-Encouraged maternal rest, hydration and food intake.  3-If formula feeding, volume according to age, pace bottled feeding, frequent burping and upright position.  Contact LC as needed for feeds/support/concerns/questionsAll questions answered at this time. Provided Lactation services brochure and promoted [employer] booklet information.      Maternal Data Has patient been taught Hand Expression?: No Does the patient have breastfeeding experience prior to this delivery?: Yes How long did the patient breastfeed?: 12 months x 2, combo feeding  Feeding Mother's Current Feeding Choice: Breast Milk and Formula  Interventions Interventions: Breast feeding basics reviewed;Skin to skin;LC Services brochure;Education;Pace feeding  Discharge Pump:  (May request a manual pump) WIC Program: Yes  Consult Status Consult Status: Follow-up Date: 05/20/22 Follow-up type: In-patient    Michele Stephens 05/19/2022, 9:23 AM

## 2022-05-20 MED ORDER — IBUPROFEN 600 MG PO TABS
600.0000 mg | ORAL_TABLET | Freq: Four times a day (QID) | ORAL | 0 refills | Status: DC
Start: 2022-05-20 — End: 2023-01-23

## 2022-05-20 NOTE — Lactation Note (Signed)
This note was copied from a baby's chart. Lactation Consultation Note  Patient Name: Michele Stephens [id number] Date: 05/20/2022 Reason for consult: Follow-up assessment Age:30 hours Spanish interpreter used via video. P3, Encouraged mother to continue offering breast before formula to help establish her milk supply. Reviewed engorgement care and monitoring voids/stools.     Feeding Mother's Current Feeding Choice: Breast Milk and Formula  Discharge Discharge Education: Engorgement and breast care;Warning signs for feeding baby  Consult Status Consult Status: Complete Date: 05/20/22    [name] [facility] 05/20/2022, 1:28 PM

## 2022-05-20 NOTE — Social Work (Signed)
CSW received consult for "Please see patient prior to discharge.  Pt needs help with resources getting formula.  Pt has questions about applying for emergency Medicaid." CSW met with MOB to offer support and complete assessment.    CSW used interpreter Eliz#[phone]  CSW met with MOB at bedside and introduced role. CSW assessed MOB for needs. MOB asked about emergency Medicaid. CSW explained to MOB that the [facility] financial counselor is aware that MOB does not have coverage and a Medicaid application will be completed on her behalf. MOB inquired if/when she would receive information about this. CSW informed that it takes time for the application to be processed and follow up information is sent via mail. MOB reported understanding. CSW inquired if MOB needed assistance with formula for the infant. MOB reported she applied for WIC and her appointment is scheduled for June 30th. MOB reported she was receiving FS however she did not recertify this month. CSW encouraged MOB call DSS and recertify the FS and notify the agency about the birth since she can purchase formula with FS. CSW inquired if MOB had relatives or family that could help purchase the milk. MOB reported that FOB can help purchase milk until the WIC appointment. CSW inquired if MOB had items for the infant. The car seat was present at bedside. MOB reported she has crib, diapers, wipes, and clothes for the infant. CSW educated MOB about [facility]. MOB gave CSW permission to make a referral.   CSW assessed MOB for additional needs. MOB reported no further need.  CSW identifies no further need for intervention and no barriers to discharge at this time.   Zakhari Fogel, MSW, LCSW [facility]  Clinical Social Worker  [phone] 05/20/2022  12:09 PM 

## 2022-05-27 ENCOUNTER — Telehealth (HOSPITAL_COMMUNITY): Payer: Self-pay | Admitting: *Deleted

## 2022-05-27 NOTE — Telephone Encounter (Signed)
Mom reports feeling good. No concerns about herself at this time. EPDS=1 [facility] score=1) Mom reports baby is doing well. Feeding, peeing, and pooping without difficulty. Safe sleep reviewed. Mom reports no concerns about baby at present.  Duffy Rhody, RN 05-27-2022 at 10:44am

## 2022-05-31 ENCOUNTER — Encounter (HOSPITAL_COMMUNITY): Payer: Self-pay | Admitting: Obstetrics and Gynecology

## 2022-06-19 NOTE — Progress Notes (Deleted)
.  fmc

## 2022-06-20 ENCOUNTER — Ambulatory Visit: Payer: No Typology Code available for payment source | Admitting: Student

## 2022-11-08 ENCOUNTER — Telehealth (HOSPITAL_COMMUNITY): Payer: Self-pay | Admitting: Lactation Services

## 2022-11-08 NOTE — Telephone Encounter (Signed)
Spanish interpreter used.  Breastfeeding mother called with complaints of pain shooting up into her breasts.  Encouraged mother to look inside infant's mouth for white patches.  Possible yeast infection.  Recommend calling Ped MD and OB/GYN to discuss and both be treated.  Suggest changing breast pads often.  Call back if mother has further questions.

## 2022-11-11 ENCOUNTER — Other Ambulatory Visit: Payer: Self-pay | Admitting: Physician Assistant

## 2022-11-11 DIAGNOSIS — N644 Mastodynia: Secondary | ICD-10-CM

## 2022-11-15 ENCOUNTER — Other Ambulatory Visit: Payer: Self-pay | Admitting: Physician Assistant

## 2022-11-15 DIAGNOSIS — N644 Mastodynia: Secondary | ICD-10-CM

## 2022-11-16 ENCOUNTER — Other Ambulatory Visit: Payer: Self-pay

## 2022-11-16 DIAGNOSIS — N644 Mastodynia: Secondary | ICD-10-CM

## 2022-11-24 IMAGING — US US ABDOMEN COMPLETE
1 series · 14 of 25 positions shown · non-contrast
Comparison: None.

CLINICAL DATA: Abdomen pain

EXAM:
ABDOMEN ULTRASOUND COMPLETE

[Series 1: us abdomen complete · 0.15mm/px · 14 of 72 slices shown]
[im 1/72]
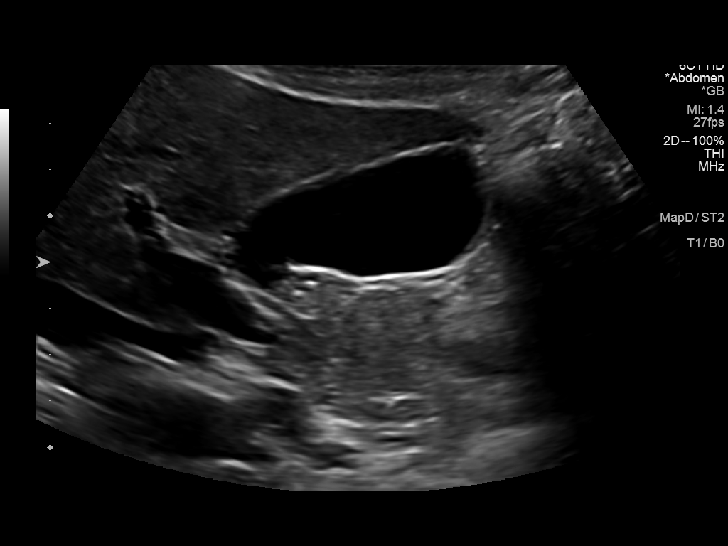
[im 6/72]
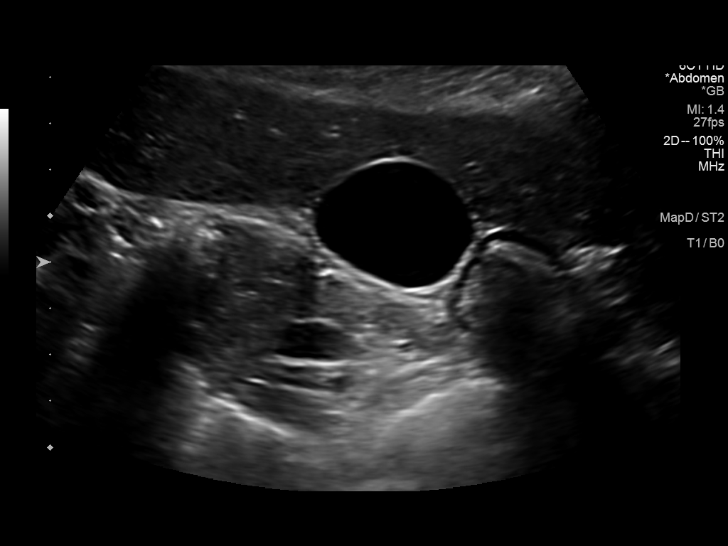
[im 12/72]
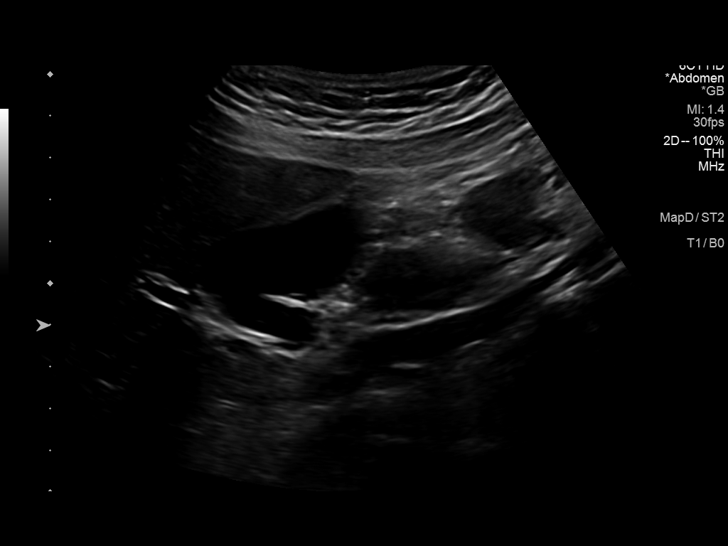
[im 18/72]
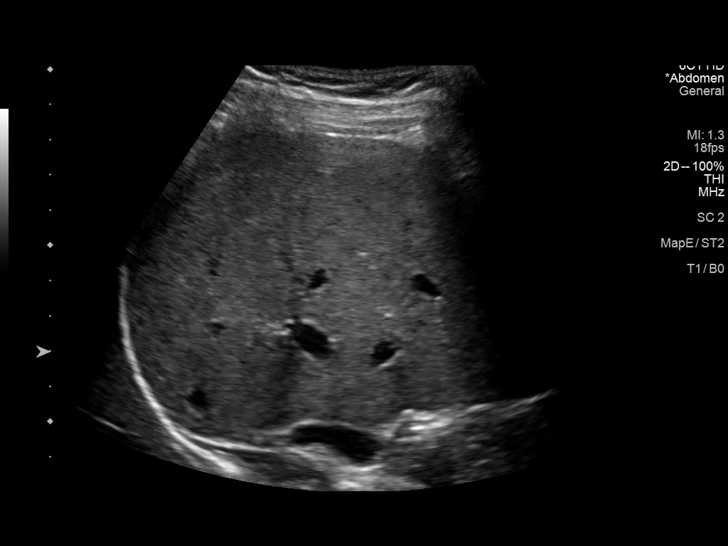
[im 24/72]
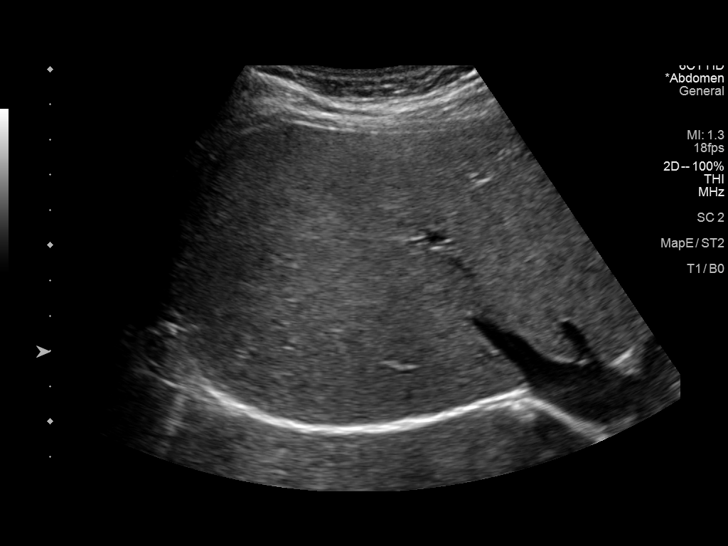
[im 27/72]
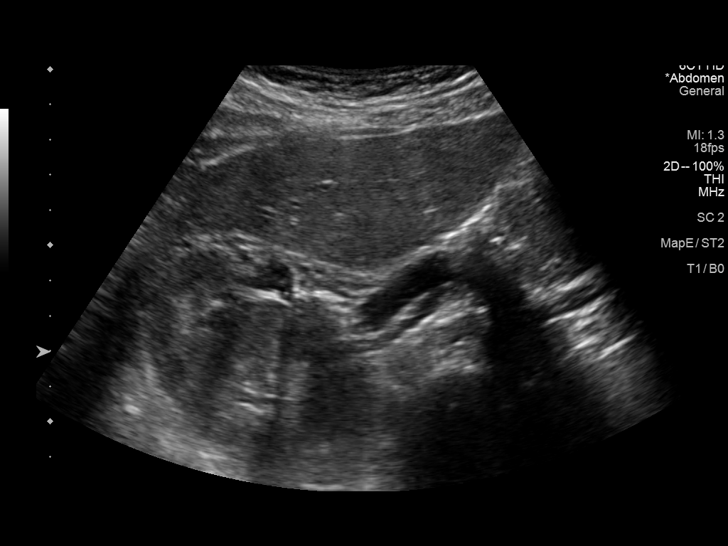
[im 33/72]
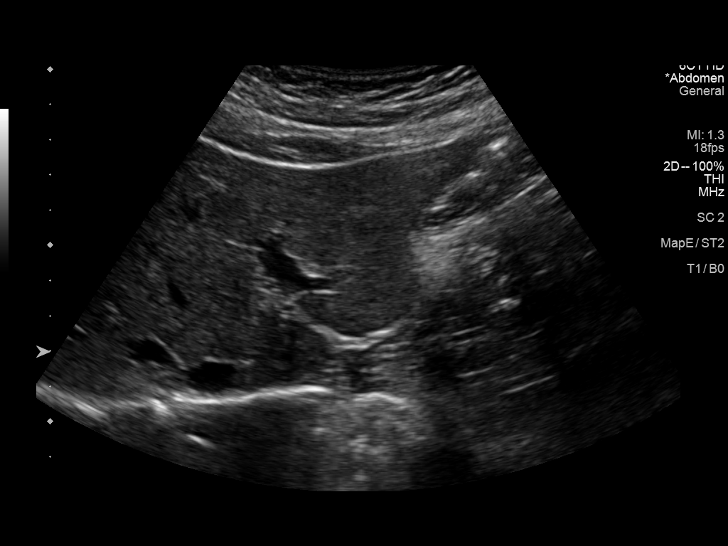
[im 39/72]
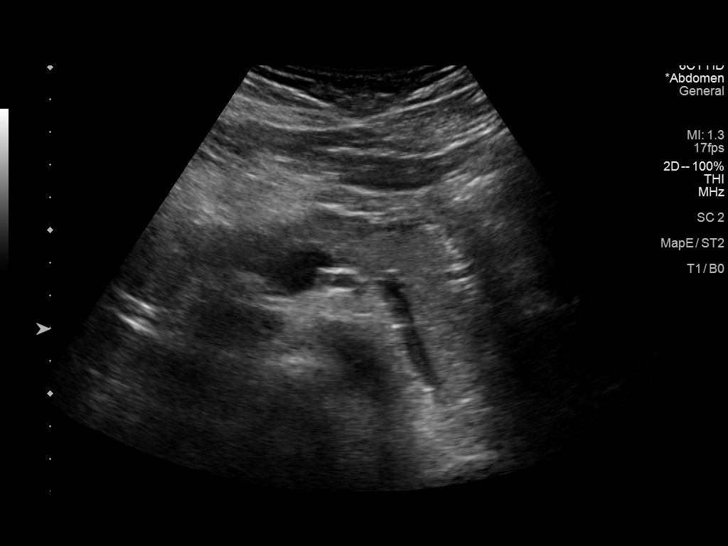
[im 45/72]
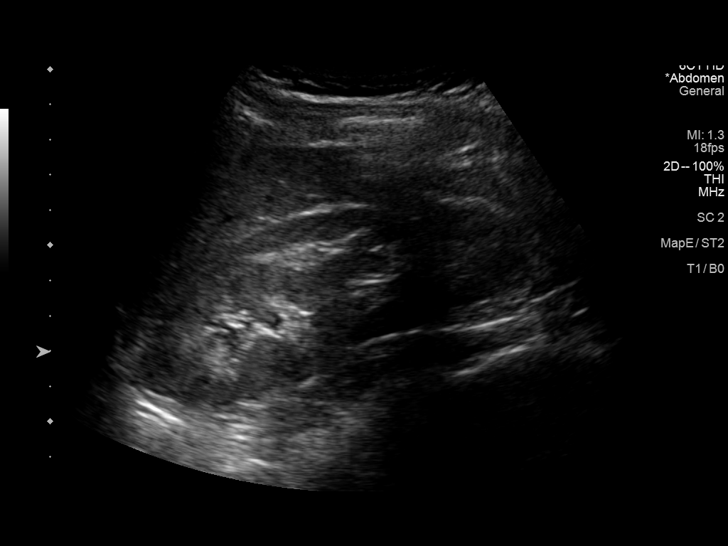
[im 48/72]
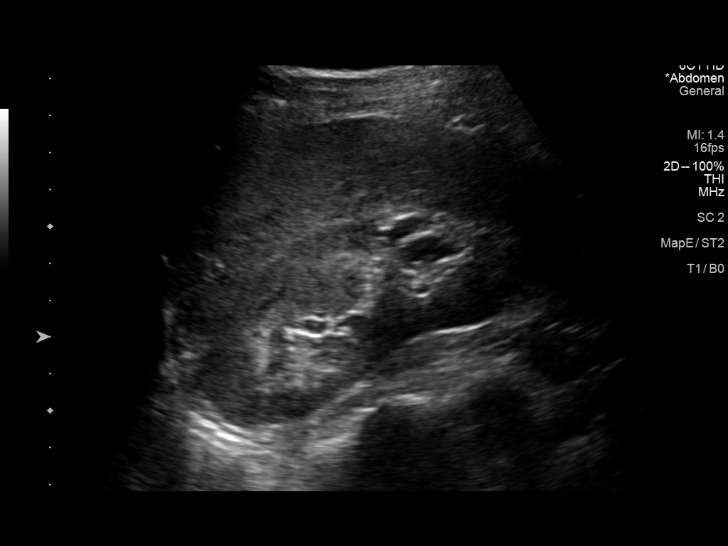
[im 54/72]
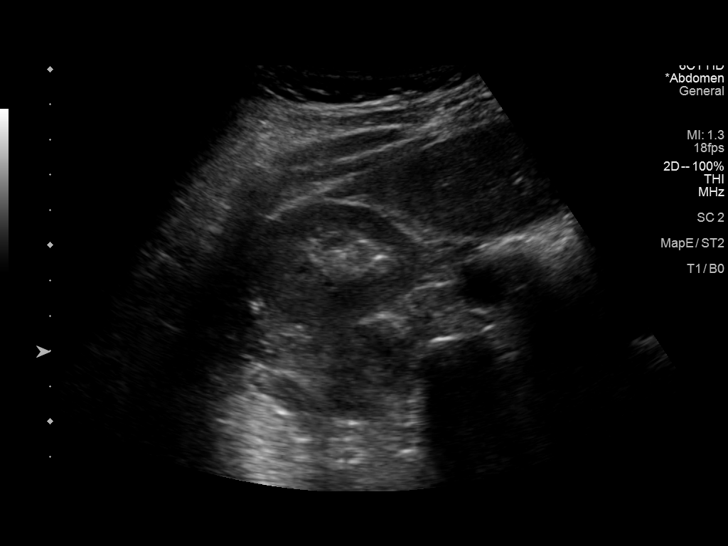
[im 60/72]
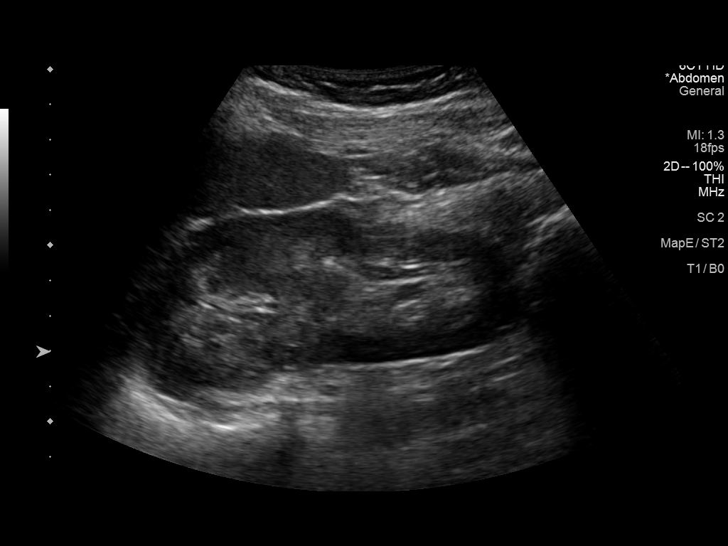
[im 66/72]
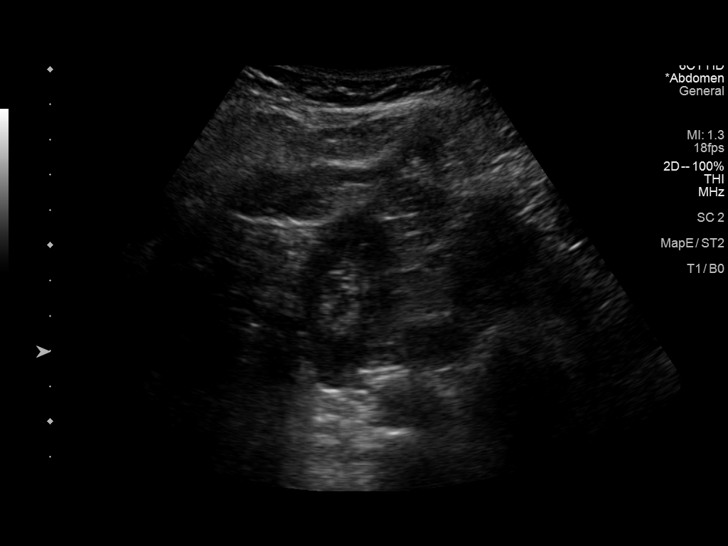
[im 72/72]
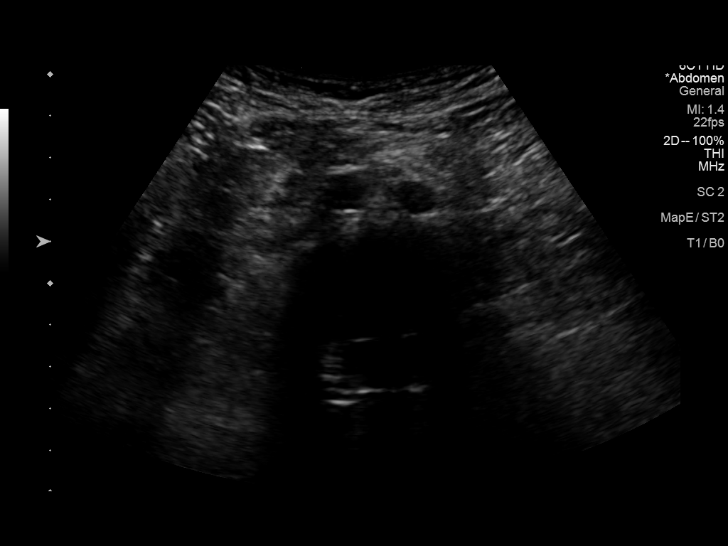

[14 of 25 positions shown; findings below may reference images not displayed]

FINDINGS: Gallbladder: No gallstones or wall thickening visualized. No
sonographic Murphy sign noted by sonographer.

Common bile duct: Diameter: 2.2 mm

Liver: No focal lesion identified. Within normal limits in
parenchymal echogenicity. Portal vein is patent on color Doppler
imaging with normal direction of blood flow towards the liver.

IVC: No abnormality visualized.

Pancreas: Visualized portion unremarkable.

Spleen: Size and appearance within normal limits.

Right Kidney: Length: 11.7 cm. Echogenicity within normal limits. No
mass or hydronephrosis visualized.

Left Kidney: Length: 11.9 cm. Echogenicity within normal limits. No
mass or hydronephrosis visualized.

Abdominal aorta: No aneurysm visualized.

Other findings: None.
IMPRESSION: Negative examination

## 2022-12-01 ENCOUNTER — Ambulatory Visit (HOSPITAL_COMMUNITY)
Admission: EM | Admit: 2022-12-01 | Discharge: 2022-12-01 | Disposition: A | Discharge status: Home / Self Care | Payer: No Typology Code available for payment source | Attending: Emergency Medicine | Admitting: Emergency Medicine

## 2022-12-01 ENCOUNTER — Encounter (HOSPITAL_COMMUNITY): Payer: Self-pay

## 2022-12-01 DIAGNOSIS — O9123 Nonpurulent mastitis associated with lactation: Secondary | ICD-10-CM

## 2022-12-01 DIAGNOSIS — N644 Mastodynia: Secondary | ICD-10-CM

## 2022-12-01 MED ORDER — CEPHALEXIN 500 MG PO CAPS: 500.0000 mg | ORAL_CAPSULE | Freq: Three times a day (TID) | ORAL | 0 refills | Status: AC

## 2022-12-01 NOTE — ED Triage Notes (Signed)
Pt reports her right breast feels and swollen x 1 month. Pt reports a lump under breast. Pt is currently breast feeding, Pt denies any drainage other then milk coming from her breast.

## 2022-12-01 NOTE — ED Provider Notes (Signed)
[facility]    CSN: [id number] Arrival date & time: 12/01/22  1512     History   Chief Complaint Chief Complaint  Patient presents with   Breast Problem    HPI Michele Stephens is a 31 y.o. female.  Medical interpretor used for this encounter Presents with 1 month history of breast pain Occurs at rest and during breastfeeding.  Right breast only. Feels burning up into the axilla. Reports she feels a lump on outer breast Ibuprofen helps temporarily, warm compress No skin changes, redness, warmth. No drainage or bleeding from the nipple. Denies fever.  Was seen by PCP for this 11/29. 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 Authorizing Provider  cephALEXin (KEFLEX) 500 MG capsule Take 1 capsule (500 mg total) by mouth 3 (three) times daily for 5 days. Tres veces (cada 6 horas) al dia para cinco dias 12/01/22 12/06/22 Yes Emanii Bugbee, Lurena Joiner, PA-C  ibuprofen (ADVIL) 600 MG tablet Take 1 tablet (600 mg total) by mouth every 6 (six) hours. 05/20/22   [name], CNM  Multiple Vitamin (MULTIVITAMIN) capsule Take 1 capsule by mouth daily.    [provider]  olopatadine (PATADAY) 0.1 % ophthalmic solution Place 1 drop into both eyes 2 (two) times daily.  04/15/22   Celedonio Savage, MD    Family History History reviewed. No pertinent family history.  Social History Social History   Tobacco Use   Smoking status: Never   Smokeless tobacco: Never  Vaping Use   Vaping Use: Never used  Substance Use Topics   Alcohol use: No   Drug use: No     Allergies   Patient has no allergy information on record.   Review of Systems Review of Systems As per HPI  Physical Exam Triage Vital Signs ED Triage Vitals  Enc Vitals Group     BP      Pulse      Resp      Temp      Temp src      SpO2      Weight      Height      Head Circumference      Peak Flow      Pain Score      Pain Loc      Pain Edu?      Excl. in GC?    No data found.  Updated Vital Signs BP 132/81 (BP Location: Left Arm)  Pulse 78   Temp 98.3 F (36.8 C) (Oral)   Resp 18   SpO2 98%   Visual Acuity Right Eye Distance:   Left Eye Distance:   Bilateral Distance:    Right Eye Near:   Left Eye Near:    Bilateral Near:     Physical Exam Vitals and nursing note reviewed. Chaperone present: offered chaperone but patient declined.  Constitutional:      General: She is not in acute distress. HENT:     Mouth/Throat:     Pharynx: Oropharynx is clear.  Cardiovascular:     Rate and Rhythm: Normal rate and regular rhythm.     Pulses: Normal pulses.     Heart sounds: Normal heart sounds.  Pulmonary:     Effort: Pulmonary effort is normal.     Breath sounds: Normal breath sounds.  Chest:       Comments: Small tender area Right lateral breast. No erythema, warmth, skin changes, nipple abnormality, discharge. Axilla non tender, no adenopathy felt Skin:    General: Skin is warm and dry.     Findings: No bruising, erythema or rash.  Neurological:     Mental Status: She is alert and oriented to person, place, and time.      UC Treatments / Results  Labs (all labs ordered are listed, but only abnormal results are displayed) Labs Reviewed - No data to  display  EKG  Radiology No results found.  Procedures Procedures   Medications Ordered in UC Medications - No data to display  Initial Impression / Assessment and Plan / UC Course  I have reviewed the triage vital signs and the nursing notes.  Pertinent labs & imaging results that were available during my care of the patient were reviewed by me and considered in my medical decision making (see chart for details).  Right breast pain Tender area that's not likely to be infection given 1 month history, but will cover for lactational mastitis just incase this is underlying cause. Keflex TID x 5 days. Discussed ultrasound/imaging will be the best way to understand why she is having pain. Her appointment is in 2 weeks. She will continue breastfeeding with both breasts, use ibu/tylenol and warm compress for symptomatic care. Discussed worsening symptoms to look for Patient agrees to plan  Final Clinical Impressions(s) / UC Diagnoses   Final diagnoses:  Breast pain, right  Nonpurulent mastitis associated with lactation     Discharge Instructions      Tome la mediacin 3 veces al da durante 5 das  Continuar amamantando con ambos senos  Aplicar compresas tibias  Continuar ibuprofen y tylenol segun sea necesario   Por favor, acuda a la cita con [employer] [facility] [place] para obtener imgenes     ED Prescriptions     Medication Sig Dispense Auth. Provider   cephALEXin (KEFLEX) 500 MG capsule Take 1 capsule (500 mg total) by mouth 3 (three) times daily for 5 days. Tres veces (cada 6 horas) al dia para cinco dias 15 capsule Caven Perine, Lurena Joiner, PA-C      PDMP not reviewed this encounter.   Kathrine Haddock 12/01/22 1839

## 2022-12-01 NOTE — Discharge Instructions (Addendum)
Tome la mediacin 3 veces al da durante 5 das  Continuar amamantando con ambos senos  Aplicar compresas tibias  Continuar ibuprofen y tylenol segun sea necesario   Por favor, acuda a la cita con [id number] [facility] [place] para obtener imgenes

## 2022-12-15 ENCOUNTER — Ambulatory Visit
Admission: RE | Admit: 2022-12-15 | Discharge: 2022-12-15 | Disposition: A | Discharge status: Home / Self Care | Payer: No Typology Code available for payment source | Source: Ambulatory Visit | Attending: Obstetrics and Gynecology | Admitting: Obstetrics and Gynecology

## 2022-12-15 ENCOUNTER — Ambulatory Visit: Payer: Self-pay | Admitting: Hematology and Oncology

## 2022-12-15 VITALS — BP 110/72 | Wt 130.0 lb

## 2022-12-15 DIAGNOSIS — N644 Mastodynia: Secondary | ICD-10-CM

## 2022-12-15 NOTE — Patient Instructions (Signed)
[place] about BSE and gave educational materials to take home. Patient did not need a Pap smear today due to last Pap smear was in 10/2022 per patient. Let her know [facility] will cover Pap smears every 3 years unless has a history of abnormal Pap smears. Referred patient to the [place] for diagnostic ultrasound. Appointment scheduled for 12/15/2022. Patient aware of appointment and will be there. Let patient know will follow up with her within the next couple weeks with results. Michele Stephens verbalized understanding.  Melodye Ped, NP 8:51 AM

## 2022-12-15 NOTE — Progress Notes (Signed)
[name] is a 32 y.o. female who presents to [facility] clinic today with complaint of right breast pain.    Pap Smear: Pap not smear completed today. Last Pap smear was 10/2022 and was normal. Per patient has no history of an abnormal Pap smear. Last Pap smear result is available in Epic.   Physical exam: Breasts Breasts symmetrical. No skin abnormalities bilateral breasts. No nipple retraction bilateral breasts. No nipple discharge bilateral breasts. No lymphadenopathy. No lumps palpated bilateral breasts. Tenderness noted to right upper outer quadrant of the breast.   Pelvic/Bimanual Pap is not indicated today    Smoking History: Patient has never smoked and was not referred to quit line.    Patient Navigation: Patient education provided. Access to services provided for patient through [place] interpreter provided. No transportation provided   Colorectal Cancer Screening: Per patient has never had colonoscopy completed No complaints today.    Breast and Cervical Cancer Risk Assessment: Patient does not have family history of breast cancer, known genetic mutations, or radiation treatment to the chest before age 26. Patient does not have history of cervical dysplasia, immunocompromised, or DES exposure in-utero.  Risk Scores   No risk assessment data     A: [facility] exam without pap smear Complaint of right breast tenderness and "burning". She is currently breast feeding her 67 month old baby.   P: Referred patient to the [facility] for a diagnostic ultrasound. Appointment scheduled 12/15/2022.  Dayton Scrape A, NP 12/15/2022 8:27 AM

## 2022-12-20 ENCOUNTER — Ambulatory Visit (HOSPITAL_COMMUNITY)
Admission: EM | Admit: 2022-12-20 | Discharge: 2022-12-20 | Disposition: A | Discharge status: Home / Self Care | Payer: No Typology Code available for payment source

## 2023-01-23 ENCOUNTER — Encounter: Payer: Self-pay | Admitting: Family Medicine

## 2023-01-23 ENCOUNTER — Ambulatory Visit (INDEPENDENT_AMBULATORY_CARE_PROVIDER_SITE_OTHER): Payer: Self-pay | Admitting: Family Medicine

## 2023-01-23 VITALS — BP 108/65 | HR 67 | Wt 128.6 lb

## 2023-01-23 DIAGNOSIS — N644 Mastodynia: Secondary | ICD-10-CM

## 2023-01-23 MED ORDER — SERTRALINE HCL 25 MG PO TABS: ORAL_TABLET | ORAL | 0 refills | Status: DC

## 2023-01-23 NOTE — Assessment & Plan Note (Signed)
  No obvious cause identified today.  Suspect some underlying neuropathy causing pain.  Fortunately breast-feeding is going well and her exam is normal.  Experts in the field of breastfeeding medicine recommend the use of low-dose SSRIs for treatment of lactational breast pain that has no other identified etiology.  Recommend trial of low-dose sertraline and follow-up in several weeks.  Pelvic exam not performed today, as this was a consult primarily for breast pain.  It is possible that SSRI could help with vaginal pain as well.  We can readdress this when I see her back.  Plan:   - Start sertraline 12.5 mg daily for 1 week, then increase to 25 mg daily. - Follow-up with me on March 4, visit scheduled - Counseled to reach out if she has any issues with the medication or any worsening in the interim

## 2023-01-23 NOTE — Progress Notes (Signed)
Date of Visit: 01/23/2023   Breastfeeding Medicine Initial Consult Note  SUBJECTIVE:   CC: right breast pain   Lactation Visit  x Lactation Problem Visit   Follow up   Referral from: Elinor Dodge, RN/IBCLC ([place] [facility]) 70 Pediatrician: [facility] Mom's OBGyn/Midwife: Came here to [facility] for [facility] (adopt a mom program) Mom's PCP:  [facility] Problem per referral: R breast pain for several months Support person present: no 2 age today: 8 months.  Baby did not accompany patient to visit today.  Pregnancy/Birth HistorySN:[id number].  [redacted] weeks gestation. Born via unmedicated SVD. Pregnancy complicated by: polyhydramnios noted on post dates BPP  Baby is doing well and growing well, according to patient.  Baby feeds at the breast or she pumps every 3-4 hours.  Typically gets about 2 to 3 ounces per breast when pumping.  Since November, has had pain in her right breast.  She describes it as a burning feeling.  It is present all the time, constant, every single day.  Notes the pain is on her right upper outer breast towards the anterior part of her shoulder, and also into her right axilla.  Denies having any blood in her milk.  Has not felt any masses in her breast.  She had a breast ultrasound done which was normal.  Does note that when she has increased stress she has more pain in her armpit.  She also reports having pain in her vaginal area around the same timeframe.  Has had several pelvic exams at her PCPs office with normal swabs.  She has been taking some form of supplement for this, which has helped a little bit.  She does not recall the name, however per referring IBCLC had been taking evening primrose oil.  Review of records: 11/09/22 - Seen by PCP at [facility] for R breast pain - complained of it being present for 2 months at that time. Had normal exam, U/S ordered. 12/01/22 - Seen at urgent care for breast pain, treated with Keflex for 5 days to cover lactational mastitis 12/15/22 - breast ultrasound  done which was normal.  She was offered mammogram but evidently declined this. 01/02/23 - seen again at [place], referred to GYN for ongoing breast pain and vaginal pain despite normal exams 01/11/23 - seen by Donne Hazel at [facility], referred to me  Patient denies any history of mental illness in herself or her family.  Has never taken medication for her mood.  Does endorse some increased stress lately.  OBJECTIVE:   BP 108/65   Pulse 67   Wt 128 lb 9.6 oz (58.3 kg)   LMP  (LMP Unknown)   SpO2 97%   BMI 24.30 kg/m   Gen: NAD, pleasant, cooperative Breasts: bilateral breasts normal in appearance. No erythema, deformity, or nipple discharge.  Nipples are everted at rest without any signs of trauma.  No blanching.  No palpable abnormal masses. No axillary lymphadenopathy.  No tenderness elicited on either breast.  No skin changes.  No signs of engorgement.  Exam chaperoned by Mercer Pod, CMA MSK: Normal range of motion of right shoulder.  No tenderness with palpation of shoulder including biceps tendon. Psych: Normal range of affect, well-groomed, normal eye contact  ASSESSMENT/PLAN:   Pain of right breast  No obvious cause identified today.  Suspect some underlying neuropathy causing pain.  Fortunately breast-feeding is going well and her exam is normal.  Experts in the field of breastfeeding medicine recommend the use of low-dose SSRIs for  treatment of lactational breast pain that has no other identified etiology.  Recommend trial of low-dose sertraline and follow-up in several weeks.  Pelvic exam not performed today, as this was a consult primarily for breast pain.  It is possible that SSRI could help with vaginal pain as well.  We can readdress this when I see her back.  Plan:   - Start sertraline 12.5 mg daily for 1 week, then increase to 25 mg daily. - Follow-up with me on March 4, visit scheduled - Counseled to reach out if she has any issues with the medication or any worsening in  the interim    Provided information for financial counseling through [facility].  Chrisandra Netters, MD [place]

## 2023-01-23 NOTE — Patient Instructions (Addendum)
Start sertraline 12.40m daily for 1 week (half a pill per day) Then after 1 week go up to 245mdaily (one whole pill).  Follow up with me in a few weeks as scheduled.  Be well, Dr. McArdelia Mems

## 2023-02-13 ENCOUNTER — Encounter: Payer: Self-pay | Admitting: Family Medicine

## 2023-02-13 ENCOUNTER — Other Ambulatory Visit: Payer: Self-pay | Admitting: Hematology and Oncology

## 2023-02-13 ENCOUNTER — Ambulatory Visit (INDEPENDENT_AMBULATORY_CARE_PROVIDER_SITE_OTHER): Payer: Self-pay | Admitting: Family Medicine

## 2023-02-13 VITALS — BP 105/70 | HR 65 | Temp 98.2°F | Ht 59.0 in | Wt 128.4 lb

## 2023-02-13 DIAGNOSIS — N949 Unspecified condition associated with female genital organs and menstrual cycle: Secondary | ICD-10-CM

## 2023-02-13 DIAGNOSIS — N644 Mastodynia: Secondary | ICD-10-CM

## 2023-02-13 MED ORDER — SERTRALINE HCL 50 MG PO TABS: 50.0000 mg | ORAL_TABLET | Freq: Every day | ORAL | 0 refills | Status: DC

## 2023-02-13 NOTE — Progress Notes (Signed)
  Date of Visit: 02/13/2023   Breastfeeding Medicine Follow Up Consult Note  SUBJECTIVE:   CC: right breast pain   Lactation Visit   Lactation Problem Visit  x Follow up   Kerrilyn presents for follow-up of her right breast pain.  Last visit we started her on low-dose sertraline.  She is taking 25 mg of sertraline daily.  Tolerating this medication well.  Reports her mood is overall doing well.  Has noted very mild improvement in the right breast pain since starting the sertraline, but it continues.  She is taking evening primrose oil 4000 mg twice daily.  Notices pain still in the upper outer quadrant of her right breast radiating upwards to her shoulder and upper arm.  It continues to be constant and burning.  She does note feeling pain in the same area when her baby directly feeds from her breast.  Breast-feeding is otherwise going well.  Baby did not accompany her to today's visit.  Has not noted any improvement in her vaginal pain since last visit.  We did not do a pelvic exam at last visit.  Denies any discharge.  Has had extensive testing for STIs.  Vaginal pain is constant and has been present for the same period of time as the right breast pain.  She does notice the pain when walking.  OBJECTIVE:   BP 105/70   Pulse 65   Temp 98.2 F (36.8 C)   Ht '4\' 11"'$  (1.499 m)   Wt 128 lb 6.4 oz (58.2 kg)   LMP  (LMP Unknown)   SpO2 100%   BMI 25.93 kg/m    No tenderness to palpation of upper right chest wall.  No tenderness over bicipital groove, right clavicle, or right shoulder.  Full range of motion of right shoulder.  Negative empty can.  Negative NeerS.  No pain elicited with shoulder movement.  GU: Normal external genitalia.  No lesions seen.  Cervix normal in appearance.  Patient identifies her mons pubis as the location of pain.  No excessive discharge in the vaginal vault.  Some tenderness with palpation of mons pubis/pubic symphysis both internally and externally.  Otherwise no  tenderness.  No masses palpated.  Exam chaperoned by Mercer Pod, CMA  ASSESSMENT/PLAN:   Pain of right breast Slight improvement with sertraline 25 mg daily, but obvious room for further improvement.  Etiology remains unclear, seems consistent with neuropathic cause of pain.  Attempted to evaluate for possible shoulder pathology today causing referred pain to the breast, but no abnormalities noted on shoulder exam.  I am hopeful that with the higher dose SSRI she may experience more improvement.  Since she never had a mammogram it is reasonable to complete that. Plan: -Increase sertraline to 50 mg daily -Diagnostic mammogram ordered -breast imaging center said she needed to go through [employer] program -sent message to NP with that program to see if this can be covered since she was already seen 1/4 by that program. 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  Referred to pelvic floor PT for further treatment.  Chrisandra Netters, MD [place]

## 2023-02-13 NOTE — Assessment & Plan Note (Signed)
Exam consistent with symphysis pubis pain.  Referred to pelvic floor PT for further treatment.

## 2023-02-13 NOTE — Assessment & Plan Note (Addendum)
Slight improvement with sertraline 25 mg daily, but obvious room for further improvement.  Etiology remains unclear, seems consistent with neuropathic cause of pain.  Attempted to evaluate for possible shoulder pathology today causing referred pain to the breast, but no abnormalities noted on shoulder exam.  I am hopeful that with the higher dose SSRI she may experience more improvement.  Since she never had a mammogram it is reasonable to complete that. Plan: -Increase sertraline to 50 mg daily -Diagnostic mammogram ordered -breast imaging center said she needed to go through [employer] program -sent message to NP with that program to see if this can be covered since she was already seen 1/4 by that program. -Follow-up in 1 month, scheduled

## 2023-02-13 NOTE — Patient Instructions (Addendum)
It was great to see you again today.  Please call the [facility] program at [phone] in order to get set up for your right breast mammogram. I am also messaging them to see if we can get you set up without you having to go in.  Referring to pelvic floor physical therapy for vaginal pain.  Increase sertraline to 50 mg daily.  Follow-up with me in 1 month.  Be well, Dr. Ardelia Mems

## 2023-02-14 ENCOUNTER — Other Ambulatory Visit: Payer: Self-pay | Admitting: Hematology and Oncology

## 2023-02-14 DIAGNOSIS — N644 Mastodynia: Secondary | ICD-10-CM

## 2023-02-20 ENCOUNTER — Ambulatory Visit: Payer: No Typology Code available for payment source | Attending: Family Medicine | Admitting: Physical Therapy

## 2023-02-20 ENCOUNTER — Encounter: Payer: Self-pay | Admitting: Physical Therapy

## 2023-02-20 ENCOUNTER — Other Ambulatory Visit: Payer: Self-pay

## 2023-02-20 DIAGNOSIS — M6281 Muscle weakness (generalized): Secondary | ICD-10-CM | POA: Insufficient documentation

## 2023-02-20 DIAGNOSIS — N949 Unspecified condition associated with female genital organs and menstrual cycle: Secondary | ICD-10-CM | POA: Insufficient documentation

## 2023-02-20 DIAGNOSIS — R293 Abnormal posture: Secondary | ICD-10-CM

## 2023-02-20 DIAGNOSIS — R102 Pelvic and perineal pain: Secondary | ICD-10-CM | POA: Insufficient documentation

## 2023-02-20 NOTE — Therapy (Signed)
OUTPATIENT PHYSICAL THERAPY FEMALE PELVIC EVALUATION   Patient Name: Michele Stephens MRN: [id number] DOB:[birth year], 32 y.o., female Today's Date: 02/20/2023  END OF SESSION:  PT End of Session - 02/20/23 1608     Visit Number 1    Date for PT Re-Evaluation 05/15/23    Authorization Type generic commercial    PT Start Time 1613    PT Stop Time 1653    PT Time Calculation (min) 40 min    Activity Tolerance Patient tolerated treatment well    Behavior During Therapy [facility] for tasks assessed/performed             Past Medical History:  Diagnosis Date   Gestational diabetes    Past Surgical History:  Procedure Laterality Date   NO PAST SURGERIES     Patient Active Problem List   Diagnosis Date Noted   Pain of female symphysis pubis 02/13/2023   Pain of right breast 01/23/2023   Language barrier 05/18/2022   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  I also have burning on Rt anterior shoulder.  Pt is breast feeling.   Fluid intake: Yes: 4 - 5 bottles of water    PAIN:  Are you having pain? Yes NPRS scale: 4/10, 7/10 at worst Pain location: Anterior and pelvis interior  Pain type: burning Pain description: intermittent   Aggravating factors: drinking acidic things like soda, intercourse worsens pain Relieving factors: green shakes, not having  intercourse  PRECAUTIONS: None  WEIGHT BEARING RESTRICTIONS: No  FALLS:  Has patient fallen in last 6 months? No  LIVING ENVIRONMENT: Lives with: lives with their family and 3 children Lives in: House/apartment   OCCUPATION: not working  PLOF: Independent  PATIENT GOALS: intercourse without pain and not have pain in general  PERTINENT HISTORY:  3 vaginal deliveries Sexual abuse: No  BOWEL MOVEMENT: Pain with bowel movement: No Normal bowel movements  URINATION: Pain with urination: No Fully empty bladder: Yes:   Stream: Strong Urgency: No Frequency: normal, nocturia  2-3x Leakage:  no Pads: No  INTERCOURSE: Pain with intercourse: Initial Penetration, During Penetration, and After Intercourse Ability to have vaginal penetration:  Yes: but not having due to pain Climax:  Marinoff Scale: 3/3  PREGNANCY: Vaginal deliveries 3 Tearing Yes: 1st one yes C-section deliveries  Currently pregnant : no  PROLAPSE: None   OBJECTIVE:   DIAGNOSTIC FINDINGS:    PATIENT SURVEYS:  {rehab surveys:24030}  PFIQ-7 ***  COGNITION: Overall cognitive status: {cognition:24006}     SENSATION: Light touch: {intact/deficits:24005} Proprioception: {intact/deficits:24005}  MUSCLE LENGTH: Hamstrings: Right 50 deg; Left 50 deg Thomas test: Right *** deg; Left *** deg  LUMBAR SPECIAL TESTS:  ASLR - improved with core and lumbar support  FUNCTIONAL TESTS:  Single leg WFL   GAIT: Distance walked: *** Assistive device utilized: {Assistive devices:23999} Level of assistance: {Levels of assistance:24026} Comments: WFL   POSTURE:  Rt rotation lumbar and posterior ilium on the Rt in standing  PELVIC ALIGNMENT:  LUMBARAROM/PROM:  A/PROM A/PROM  eval  Flexion 75%  Extension   Right lateral flexion   Left lateral flexion   Right rotation   Left rotation    (Blank rows = not tested)  LOWER EXTREMITY ROM:  {AROM/PROM:27142} ROM Right eval Left eval  Hip  flexion    Hip extension    Hip abduction    Hip adduction    Hip internal rotation    Hip external rotation 50% 50%  Knee flexion    Knee extension    Ankle dorsiflexion    Ankle plantarflexion    Ankle inversion    Ankle eversion     (Blank rows = not tested)  LOWER EXTREMITY MMT:  MMT Right eval Left eval  Hip flexion    Hip extension    Hip abduction    Hip adduction    Hip internal rotation    Hip external rotation    Knee flexion    Knee extension    Ankle dorsiflexion    Ankle plantarflexion    Ankle inversion    Ankle eversion     PALPATION:   General  ***                External Perineal Exam ***                             Internal Pelvic Floor ***  Patient confirms identification and approves PT to assess internal pelvic floor and treatment {yes/no:20286}  PELVIC MMT:   MMT eval  Vaginal   Internal Anal Sphincter   External Anal Sphincter   Puborectalis   Diastasis Recti 2 fingers  (Blank rows = not tested)        TONE: ***  PROLAPSE: ***  TODAY'S TREATMENT:  DATE: ***  EVAL ***   PATIENT EDUCATION:  Education details: *** Person educated: {Person educated:25204} Education method: {Education Method:25205} Education comprehension: {Education Comprehension:25206}  HOME EXERCISE PROGRAM: Access Code: [email or web address] URL: [email or web address] Date: 02/20/2023 Prepared by: Jari Favre  Exercises - Seated Hamstring Stretch  - 1 x daily - 7 x weekly - 1 sets - 3 reps - 30 sec hold - Seated Piriformis Stretch with Trunk Bend  - 1 x daily - 7 x weekly - 1 sets - 3 reps - 30 sec hold - Supine Piriformis Stretch  - 1 x daily - 7 x weekly - 1 sets - 3 reps - 30 sec hold - Diaphragmatic Breathing in Child's Pose with Pelvic Floor Relaxation  - 1 x daily - 7 x weekly - 1 sets - 5 reps - 30 sec  hold  ASSESSMENT:  CLINICAL IMPRESSION: Patient is a *** y.o. *** who was seen today for physical therapy evaluation and treatment for ***.  Pt has 1/5 MMT due to poor circular contraction.  Pt has tight and tenderness to ileococcygeus and obturator especially on the right.  Pt has weakness in th ecore with diastasis and positive ASLR.  Pt has very tight gluteals and hamstrings.  Pt will benefit from skilled PT to address impairments and muscle imbalnces for improved function and pain management  OBJECTIVE IMPAIRMENTS: {opptimpairments:25111}.   ACTIVITY LIMITATIONS: {activitylimitations:27494}  PARTICIPATION LIMITATIONS: {participationrestrictions:25113}  PERSONAL FACTORS: {Personal factors:25162} are also affecting patient's functional outcome.   REHAB POTENTIAL: {rehabpotential:25112}  CLINICAL DECISION MAKING: {clinical decision making:25114}  EVALUATION COMPLEXITY: {Evaluation complexity:25115}   GOALS: Goals reviewed with patient? {yes/no:20286}  SHORT TERM GOALS: Target date: ***  *** Baseline: Goal status: {GOALSTATUS:25110}  2.  *** Baseline:  Goal status: {GOALSTATUS:25110}  3.  *** Baseline:  Goal status: {GOALSTATUS:25110}  4.  *** Baseline:  Goal status: {GOALSTATUS:25110}  5.  *** Baseline:  Goal status: {GOALSTATUS:25110}  6.  *** Baseline:  Goal status: {GOALSTATUS:25110}  LONG TERM GOALS: Target date: ***  *** Baseline:  Goal status: {GOALSTATUS:25110}  2.  *** Baseline:  Goal status: {GOALSTATUS:25110}  3.  *** Baseline:  Goal status: {GOALSTATUS:25110}  4.  *** Baseline:  Goal status: {GOALSTATUS:25110}  5.  *** Baseline:  Goal status: {GOALSTATUS:25110}  6.  *** Baseline:  Goal status: {GOALSTATUS:25110}  PLAN:  PT FREQUENCY: {rehab frequency:25116}  PT DURATION: {rehab duration:25117}  PLANNED INTERVENTIONS: {rehab planned interventions:25118::"Therapeutic exercises","Therapeutic activity","Neuromuscular  re-education","Balance training","Gait training","Patient/Family education","Self Care","Joint mobilization"}  PLAN FOR NEXT SESSION: ***   Camillo Flaming Zyana Amaro, PT 02/20/2023, 4:14 PM

## 2023-02-28 ENCOUNTER — Ambulatory Visit
Admission: RE | Admit: 2023-02-28 | Discharge: 2023-02-28 | Disposition: A | Discharge status: Home / Self Care | Payer: No Typology Code available for payment source | Source: Ambulatory Visit | Attending: Hematology and Oncology | Admitting: Hematology and Oncology

## 2023-02-28 ENCOUNTER — Other Ambulatory Visit: Payer: Self-pay | Admitting: Hematology and Oncology

## 2023-02-28 DIAGNOSIS — N644 Mastodynia: Secondary | ICD-10-CM

## 2023-03-13 ENCOUNTER — Other Ambulatory Visit: Payer: Self-pay | Admitting: Family Medicine

## 2023-03-13 ENCOUNTER — Encounter: Payer: Self-pay | Admitting: Family Medicine

## 2023-03-13 ENCOUNTER — Ambulatory Visit (INDEPENDENT_AMBULATORY_CARE_PROVIDER_SITE_OTHER): Payer: Self-pay | Admitting: Family Medicine

## 2023-03-13 VITALS — BP 103/66 | HR 73 | Wt 123.0 lb

## 2023-03-13 DIAGNOSIS — M25511 Pain in right shoulder: Secondary | ICD-10-CM

## 2023-03-13 DIAGNOSIS — G8929 Other chronic pain: Secondary | ICD-10-CM

## 2023-03-13 DIAGNOSIS — N644 Mastodynia: Secondary | ICD-10-CM

## 2023-03-13 NOTE — Progress Notes (Signed)
  Date of Visit: 03/13/2023   Breastfeeding Medicine Follow Up Consult Note  SUBJECTIVE:   CC: right breast pain   Lactation Visit   Lactation Problem Visit  x Follow up   Michele Stephens presents for follow-up of her right breast pain.  Last visit we started her on low-dose sertraline.  She is taking 25 mg of sertraline daily.  Tolerating this medication well.  Reports her mood is overall doing well.  Has noted very mild improvement in the right breast pain since starting the sertraline, but it continues.  She is taking evening primrose oil 2000 mg twice daily.  Notices pain still in the upper outer quadrant of her right breast radiating upwards to her shoulder and upper arm.  It continues to be constant and burning.  She does note feeling pain in the same area when her baby directly feeds from her breast.  Breast-feeding is otherwise going well.  Baby did not accompany her to today's visit.  OBJECTIVE:   BP 103/66   Pulse 73   Wt 123 lb (55.8 kg)   SpO2 100%   BMI 24.84 kg/m    No tenderness to palpation of upper right chest wall.  No tenderness over bicipital groove, right clavicle, or right shoulder.  Full range of motion of right shoulder.  Negative empty can.  Negative NeerS.  No pain elicited with shoulder movement.  GU: Normal external genitalia.  No lesions seen.  Cervix normal in appearance.  Patient identifies her mons pubis as the location of pain.  No excessive discharge in the vaginal vault.  Some tenderness with palpation of mons pubis/pubic symphysis both internally and externally.  Otherwise no tenderness.  No masses palpated.  Exam chaperoned by Michele Stephens, CMA  ASSESSMENT/PLAN:   No problem-specific Assessment & Plan notes found for this encounter.   Chrisandra Netters, MD [place]

## 2023-03-13 NOTE — Patient Instructions (Signed)
It was great to see you again today.  I will reach out to some experts on breastfeeding and see if they have any other suggestions for things we can try. I will call you if I get any good ideas.  Be well, Dr. Ardelia Mems

## 2023-03-15 NOTE — Assessment & Plan Note (Signed)
Persists without improvement on SSRI, which she has since stopped.  Normal breast imaging evaluation.  I am not certain what else to offer her at this time.  I am wondering if she actually has some shoulder pathology that is presenting as breast pain.  Discussed option for referral to sports medicine, patient is agreeable.  Referral entered.  I will also reach out to some colleagues working in breastfeeding medicine and will see if they have any other ideas, patient granted me permission to do this.

## 2023-03-31 ENCOUNTER — Ambulatory Visit (INDEPENDENT_AMBULATORY_CARE_PROVIDER_SITE_OTHER): Payer: Self-pay | Admitting: Family Medicine

## 2023-03-31 ENCOUNTER — Encounter: Payer: Self-pay | Admitting: Family Medicine

## 2023-03-31 VITALS — BP 96/58 | Ht 59.0 in | Wt 123.0 lb

## 2023-03-31 DIAGNOSIS — M7521 Bicipital tendinitis, right shoulder: Secondary | ICD-10-CM

## 2023-03-31 DIAGNOSIS — M25532 Pain in left wrist: Secondary | ICD-10-CM

## 2023-03-31 NOTE — Progress Notes (Signed)
  Michele Stephens - 32 y.o. female MRN [id number]  Date of birth: 24-Jul-1991    SUBJECTIVE:      Chief Complaint:/ HPI:    Entire visit conducted with the use of interpreter, [place]. Several weeks to months of right shoulder pain.  Radiates to times down into the chest/breast.  She has been doing some exercise and certain exercises cause this to be worse.  There are times during the day when she has no pain.  She is right-hand dominant.  She is mostly concerned because she does not want to miss something worrisome and because it seems to be impairing her ability to exercise. 2.  Left wrist pain: She had a baby in June and during labor she strained her left wrist.  Since then it has never quite gotten back to baseline.  It hurts on the top of the wrist and seems to click at times.  No swelling.  She is right-hand dominant.  No numbness or tingling in her fingers.   OBJECTIVE: BP (!) 96/58   Ht  (1.499 m)   Wt 123 lb (55.8 kg)   BMI 24.84 kg/m   Physical Exam:  Vital signs are reviewed. GENERAL: Well-developed female no acute distress SHOULDERS: Bilaterally are symmetrical she has full range of motion and intact strength in all planes of the rotator cuff.  She has mild tenderness to palpation over the long head of the biceps on the right and this reproduces her pain.  She also has some pain with active abduction on that side.  Distally she is neurovascularly intact. WRIST: Left.  Mild tenderness to palpation over the dorsum of the third compartment.  Strength and range of motion is intact.  The left and right wrist are symmetrical.  ASSESSMENT & PLAN:  See problem based charting & AVS for pt instructions. No problem-specific Assessment & Plan notes found for this encounter. #1.  Bicipital tendinitis long head on the right.  Mild.  Home exercise program given.  Topical Aspercreme.  Follow-up 3 to 4 weeks.  Discussed benign nature of this but that it may take several weeks to go away  even if she is diligent with the exercises. 2.  Mild tendinitis of the left wrist.  Home exercise program given for that as well.  Would recommend icing of that as well.  Answered all questions.

## 2023-03-31 NOTE — Patient Instructions (Signed)
I think you have biceps tendinitis in the right shoulder and some extensor tendinitis in the left wrist.  We have given you a home exercise. program for both.    Please follow that doing the exercises at least once and preferably twice a day for the next several weeks.    You can also buy some over-the-counter Aspercreme to rub on the area once or twice a day.    If you have some mild increase in pain when she start the exercises that is not unexpected.  You should not have any significant increase in pain.  I would like to see you back in 3 to 4 weeks.  If you have any new or worsening symptoms or have questions, please call the office before then.  Great to meet you!

## 2023-04-06 ENCOUNTER — Ambulatory Visit: Payer: No Typology Code available for payment source | Attending: Family Medicine | Admitting: Physical Therapy

## 2023-04-06 DIAGNOSIS — R293 Abnormal posture: Secondary | ICD-10-CM

## 2023-04-06 DIAGNOSIS — M6281 Muscle weakness (generalized): Secondary | ICD-10-CM

## 2023-04-06 NOTE — Therapy (Signed)
OUTPATIENT PHYSICAL THERAPY FEMALE PELVIC TREATMENT   Patient Name: Michele Stephens MRN: [id number] DOB:03-28-1991, 32 y.o., female Today's Date: 04/06/2023  END OF SESSION:  PT End of Session - 04/06/23 1211     Visit Number 2    Date for PT Re-Evaluation 05/15/23    Authorization Type generic commercial    PT Start Time 1155    PT Stop Time 1233    PT Time Calculation (min) 38 min    Activity Tolerance Patient tolerated treatment well    Behavior During Therapy [facility] for tasks assessed/performed              Past Medical History:  Diagnosis Date   Gestational diabetes    Past Surgical History:  Procedure Laterality Date   NO PAST SURGERIES     Patient Active Problem List   Diagnosis Date Noted   Pain of female symphysis pubis 02/13/2023   Pain of right breast 01/23/2023   Language barrier 05/18/2022   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    PCP: Quita Skye, PA-C   REFERRING PROVIDER: Latrelle Dodrill, MD   REFERRING DIAG: N94.9 (ICD-10-CM) - Pain of female symphysis pubis   THERAPY DIAG:  Abnormal posture  Muscle weakness (generalized)  Rationale for Evaluation and Treatment: Rehabilitation  ONSET DATE: October 2023  SUBJECTIVE:                                                                                                                                                                                           SUBJECTIVE STATEMENT: Pt has had a couple bumps that she is noticing around vaginal canal and feels like it is worse.  Pt states she is going to see a gynecologist about that. Fluid intake: Yes: 4 - 5 bottles of water    PAIN:  Are you having pain? Yes NPRS scale: 4/10, 7/10 at worst Pain location: Anterior and pelvis interior  Pain type: burning Pain description: intermittent   Aggravating factors: drinking acidic things like soda, intercourse worsens pain Relieving factors: green shakes, not having  intercourse  PRECAUTIONS: None  WEIGHT BEARING RESTRICTIONS: No  FALLS:  Has patient fallen in last 6 months? No  LIVING ENVIRONMENT: Lives with: lives with their family and 3 children Lives in: House/apartment   OCCUPATION: not working  PLOF: Independent  PATIENT GOALS: intercourse without pain and not have pain in general  PERTINENT HISTORY:  3 vaginal deliveries Sexual abuse: No  BOWEL MOVEMENT: Pain with bowel movement: No Normal bowel movements  URINATION: Pain with urination: No Fully empty bladder: Yes:   Stream: Strong Urgency: No Frequency: normal, nocturia 2-3x  Leakage:  no Pads: No  INTERCOURSE: Pain with intercourse: Initial Penetration, During Penetration, and After Intercourse Ability to have vaginal penetration:  Yes: but not having due to pain Climax:  Marinoff Scale: 3/3  PREGNANCY: Vaginal deliveries 3 Tearing Yes: 1st one yes C-section deliveries  Currently pregnant : no  PROLAPSE: None   OBJECTIVE:   DIAGNOSTIC FINDINGS:    PATIENT SURVEYS:    PFIQ-7   COGNITION: Overall cognitive status: Within functional limits for tasks assessed     SENSATION: Light touch: Appears intact Proprioception: Appears intact  MUSCLE LENGTH: Hamstrings: Right 50 deg; Left 50 deg Thomas test:   LUMBAR SPECIAL TESTS:  ASLR - improved with core and lumbar support  FUNCTIONAL TESTS:  Single leg WFL   GAIT:  Comments: WFL   POSTURE:  Rt rotation lumbar and posterior ilium on the Rt in standing  PELVIC ALIGNMENT:  LUMBARAROM/PROM:  A/PROM A/PROM  eval  Flexion 75%  Extension   Right lateral flexion   Left lateral flexion   Right rotation   Left rotation    (Blank rows = not tested)  LOWER EXTREMITY ROM:  Passive ROM Right eval Left eval  Hip flexion    Hip extension    Hip abduction    Hip adduction    Hip internal rotation    Hip external rotation 50% 50%  Knee flexion    Knee extension    Ankle dorsiflexion     Ankle plantarflexion    Ankle inversion    Ankle eversion     (Blank rows = not tested)  LOWER EXTREMITY MMT:  MMT Right eval Left eval  Hip flexion    Hip extension    Hip abduction    Hip adduction    Hip internal rotation    Hip external rotation    Knee flexion    Knee extension    Ankle dorsiflexion    Ankle plantarflexion    Ankle inversion    Ankle eversion     PALPATION:   General  tight lumbar, gluteals, hamstrings                External Perineal Exam normal                             Internal Pelvic Floor tight and tender to palpation obturator and ileococcygeus, difficulty relaxing  Patient confirms identification and approves PT to assess internal pelvic floor and treatment Yes  PELVIC MMT:   MMT eval  Vaginal 1/5 x 5 reps, 2 sec holding and difficulty relaxing  Internal Anal Sphincter   External Anal Sphincter   Puborectalis   Diastasis Recti 2 fingers  (Blank rows = not tested)        TONE: high  PROLAPSE: None observed  TODAY'S TREATMENT:  DATE: 04/06/23  Patient confirms identification and approves physical therapist to perform internal soft tissue work - observed region around introitus; unable to see or feel anything lumpy, but  more white coloring where hymen remnants skin is located - skin in tact and normal elasticity, no report of pain with palpation SMT to bil levators and ileococcygeus, bil obturator some tenderness Exercises:  Hip rotation in supine Happy baby stretch and breathing  PATIENT EDUCATION:  Education details: Access Code: [email or web address] Person educated: Patient Education method: Programmer, multimedia, Demonstration, Verbal cues, and Handouts Education comprehension: verbalized understanding  HOME EXERCISE PROGRAM: Access Code: [email or web address] URL: [email or web address] Date: 02/20/2023 Prepared by:  Dwana Curd  Exercises - Seated Hamstring Stretch  - 1 x daily - 7 x weekly - 1 sets - 3 reps - 30 sec hold - Seated Piriformis Stretch with Trunk Bend  - 1 x daily - 7 x weekly - 1 sets - 3 reps - 30 sec hold - Supine Piriformis Stretch  - 1 x daily - 7 x weekly - 1 sets - 3 reps - 30 sec hold - Diaphragmatic Breathing in Child's Pose with Pelvic Floor Relaxation  - 1 x daily - 7 x weekly - 1 sets - 5 reps - 30 sec hold  ASSESSMENT:  CLINICAL IMPRESSION: Today's session focused on soft tissue release internally to pelvic floor muscle release and working on down training.  Pt was given stretches with breathing to do at home to continue to maintain progress achieved during today's treatment.  Pt will benefit from skilled PT to address impairments and muscle imbalnces for improved function and pain management.  OBJECTIVE IMPAIRMENTS: decreased coordination, decreased endurance, decreased ROM, decreased strength, increased muscle spasms, impaired flexibility, impaired tone, postural dysfunction, and pain.   ACTIVITY LIMITATIONS:  pain with everything but is not limiting  PARTICIPATION LIMITATIONS: interpersonal relationship  PERSONAL FACTORS: 1 comorbidity: 3 vaginal deliveries  are also affecting patient's functional outcome.   REHAB POTENTIAL: Excellent  CLINICAL DECISION MAKING: Evolving/moderate complexity  EVALUATION COMPLEXITY: Moderate   GOALS: Goals reviewed with patient? Yes  SHORT TERM GOALS: Target date: 03/20/23  Ind with initial HEP Baseline: Goal status: MET    LONG TERM GOALS: Target date: 05/15/23  Pt will be independent with advanced HEP to maintain improvements made throughout therapy  Baseline:  Goal status: IN PROGRESS  2.  Pt will report 75% reduction of pain due to improvements in posture, strength, and muscle length  Baseline:  Goal status: IN PROGRESS  3.  Pt will have 0/3 score of Marinoff scale  Baseline:  Goal status: IN  PROGRESS    PLAN:  PT FREQUENCY: 1x/week  PT DURATION: 12 weeks  PLANNED INTERVENTIONS: Therapeutic exercises, Therapeutic activity, Neuromuscular re-education, Balance training, Gait training, Patient/Family education, Self Care, Joint mobilization, Aquatic Therapy, Dry Needling, Electrical stimulation, Cryotherapy, Moist heat, scar mobilization, Taping, Biofeedback, Manual therapy, and Re-evaluation  PLAN FOR NEXT SESSION: Manual to lumbar, gluteals, stretches for increased flexibility, core strength   Brayton Caves Natayla Cadenhead, PT 04/06/2023, 5:44 PM

## 2023-04-07 ENCOUNTER — Encounter: Payer: Self-pay | Admitting: Obstetrics & Gynecology

## 2023-04-07 ENCOUNTER — Ambulatory Visit (INDEPENDENT_AMBULATORY_CARE_PROVIDER_SITE_OTHER): Payer: Self-pay | Admitting: Obstetrics & Gynecology

## 2023-04-07 VITALS — BP 118/80 | HR 68

## 2023-04-07 DIAGNOSIS — Z30011 Encounter for initial prescription of contraceptive pills: Secondary | ICD-10-CM

## 2023-04-07 DIAGNOSIS — N912 Amenorrhea, unspecified: Secondary | ICD-10-CM

## 2023-04-07 DIAGNOSIS — N644 Mastodynia: Secondary | ICD-10-CM

## 2023-04-07 DIAGNOSIS — Z789 Other specified health status: Secondary | ICD-10-CM

## 2023-04-07 LAB — PREGNANCY, URINE: Preg Test, Ur: NEGATIVE

## 2023-04-07 MED ORDER — NORETHIN ACE-ETH ESTRAD-FE 1-20 MG-MCG(24) PO TABS: 1.0000 | ORAL_TABLET | Freq: Every day | ORAL | 4 refills | Status: DC

## 2023-04-07 NOTE — Progress Notes (Signed)
    Michele Stephens [facility] 10-Jun-1991 [id number]        32 y.o.  G3P3L3 Daughter is 9 months/Breastfeeding  RP: Amenorrhea/Breastfeeding  HPI: Amenorrhea/Breastfeeding her 59 month old daughter.  Sexually active, last time was 2 weeks ago, not always using condoms.  Would like contraception.  C/O occasional Rt breast discomfort, worse when the breast is congested with milk.  Rt breast US 02/28/23 was Negative.  No breast pain currently, no redness, no fever.  Occasional vulvar bumps.   OB History  Gravida Para Term Preterm AB Living  3 3 3    0 3  SAB IAB Ectopic Multiple Live Births  0   0 0 3    # Outcome Date GA Lbr Len/2nd Weight Sex Delivery Anes PTL Lv  3 Term 05/18/22 [redacted]w[redacted]d 01:07 / 00:10 8 lb 6 oz (3.8 kg) F Vag-Spont None  LIV  2 Term 09/01/19 [redacted]w[redacted]d 19:18 / 00:31 7 lb 10.2 oz (3.465 kg) F Vag-Spont None  LIV  1 Term 03/14/16 [redacted]w[redacted]d 28:14 / 02:20 7 lb 9.2 oz (3.435 kg) F Vag-Spont EPI, Local  LIV    Past medical history,surgical history, problem list, medications, allergies, family history and social history were all reviewed and documented in the EPIC chart.   Directed ROS with pertinent positives and negatives documented in the history of present illness/assessment and plan.  Exam:  Vitals:   04/07/23 1115  BP: 118/80  Pulse: 68  SpO2: 98%   General appearance:  Normal  Breast exam:  Rt and Lt breast exam normal.  No axillary LN felt bilaterally  Gynecologic exam: Vulva normal, no lesion present.  Bimanual exam:  Uterus AV, normal volume, mobile, NT.  No adnexal mass, NT.  UPT Neg   Assessment/Plan:  31 y.o. G3P3003   1. Amenorrhea Amenorrhea/Breastfeeding her 98 month old daughter.  Sexually active, last time was 2 weeks ago, not always using condoms.  UPT Neg.  Would like contraception.  BCPs counseling done.  Decision to start on Lomedia Fe 1/20 24. No CI.  Usage reviewed, prescription sent to pharmacy.  Will use condoms through the first pack. - Pregnancy,  urine  2. Breastfeeding (infant) C/O occasional Rt breast discomfort, worse when the breast is congested with milk.  Rt breast US 02/28/23 was Negative.  No breast pain currently, no redness, no fever.  Normal breast exam.  Reassured.  - Pregnancy, urine  3. Pain of right breast C/O occasional Rt breast discomfort, worse when the breast is congested with milk.  Rt breast US 02/28/23 was Negative.  No breast pain currently, no redness, no fever.  Normal breast exam.  Reassured.   4. Encounter for initial prescription of contraceptive pills Sexually active, last time was 2 weeks ago, not always using condoms.  UPT Neg.  Would like contraception.  BCPs counseling done.  Decision to start on Lomedia Fe 1/20 24. No CI.  Usage reviewed, prescription sent to pharmacy.  Will use condoms through the first pack.  Other orders - Norethindrone Acetate-Ethinyl Estrad-FE (LOESTRIN 24 FE) 1-20 MG-MCG(24) tablet; Take 1 tablet by mouth daily.   Genia Del MD, 11:19 AM 04/07/2023

## 2023-04-13 ENCOUNTER — Ambulatory Visit: Payer: No Typology Code available for payment source | Attending: Family Medicine | Admitting: Physical Therapy

## 2023-04-13 DIAGNOSIS — R293 Abnormal posture: Secondary | ICD-10-CM

## 2023-04-13 DIAGNOSIS — M6281 Muscle weakness (generalized): Secondary | ICD-10-CM | POA: Insufficient documentation

## 2023-04-13 NOTE — Therapy (Signed)
OUTPATIENT PHYSICAL THERAPY FEMALE PELVIC TREATMENT   Patient Name: Michele Stephens MRN: [id number] DOB:09-25-1991, 32 y.o., female Today's Date: 04/13/2023  END OF SESSION:  PT End of Session - 04/13/23 1155     Visit Number 3    Date for PT Re-Evaluation 05/15/23    Authorization Type generic commercial    PT Start Time 1152    PT Stop Time 1230    PT Time Calculation (min) 38 min    Activity Tolerance Patient tolerated treatment well    Behavior During Therapy [facility] for tasks assessed/performed              Past Medical History:  Diagnosis Date   Gestational diabetes    Past Surgical History:  Procedure Laterality Date   NO PAST SURGERIES     Patient Active Problem List   Diagnosis Date Noted   Pain of female symphysis pubis 02/13/2023   Pain of right breast 01/23/2023   Language barrier 05/18/2022   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    PCP: Quita Skye, PA-C   REFERRING PROVIDER: Latrelle Dodrill, MD   REFERRING DIAG: N94.9 (ICD-10-CM) - Pain of female symphysis pubis   THERAPY DIAG:  Abnormal posture  Muscle weakness (generalized)  Rationale for Evaluation and Treatment: Rehabilitation  ONSET DATE: October 2023  SUBJECTIVE:                                                                                                                                                                                           SUBJECTIVE STATEMENT: Pt has had a couple bumps that she is noticing around vaginal canal and feels like it is worse.  Pt states she is going to see a gynecologist about that. Fluid intake: Yes: 4 - 5 bottles of water    PAIN:  Are you having pain? Yes NPRS scale: 4/10, 7/10 at worst Pain location: Anterior and pelvis interior  Pain type: burning Pain description: intermittent   Aggravating factors: drinking acidic things like soda, intercourse worsens pain Relieving factors: green shakes, not having intercourse  PRECAUTIONS:  None  WEIGHT BEARING RESTRICTIONS: No  FALLS:  Has patient fallen in last 6 months? No  LIVING ENVIRONMENT: Lives with: lives with their family and 3 children Lives in: House/apartment   OCCUPATION: not working  PLOF: Independent  PATIENT GOALS: intercourse without pain and not have pain in general  PERTINENT HISTORY:  3 vaginal deliveries Sexual abuse: No  BOWEL MOVEMENT: Pain with bowel movement: No Normal bowel movements  URINATION: Pain with urination: No Fully empty bladder: Yes:   Stream: Strong Urgency: No Frequency: normal, nocturia 2-3x  Leakage:  no Pads: No  INTERCOURSE: Pain with intercourse: Initial Penetration, During Penetration, and After Intercourse Ability to have vaginal penetration:  Yes: but not having due to pain Climax:  Marinoff Scale: 3/3  PREGNANCY: Vaginal deliveries 3 Tearing Yes: 1st one yes C-section deliveries  Currently pregnant : no  PROLAPSE: None   OBJECTIVE:   DIAGNOSTIC FINDINGS:    PATIENT SURVEYS:    PFIQ-7   COGNITION: Overall cognitive status: Within functional limits for tasks assessed     SENSATION: Light touch: Appears intact Proprioception: Appears intact  MUSCLE LENGTH: Hamstrings: Right 50 deg; Left 50 deg Thomas test:   LUMBAR SPECIAL TESTS:  ASLR - improved with core and lumbar support  FUNCTIONAL TESTS:  Single leg WFL   GAIT:  Comments: WFL   POSTURE:  Rt rotation lumbar and posterior ilium on the Rt in standing  PELVIC ALIGNMENT:  LUMBARAROM/PROM:  A/PROM A/PROM  eval  Flexion 75%  Extension   Right lateral flexion   Left lateral flexion   Right rotation   Left rotation    (Blank rows = not tested)  LOWER EXTREMITY ROM:  Passive ROM Right eval Left eval  Hip flexion    Hip extension    Hip abduction    Hip adduction    Hip internal rotation    Hip external rotation 50% 50%  Knee flexion    Knee extension    Ankle dorsiflexion    Ankle plantarflexion     Ankle inversion    Ankle eversion     (Blank rows = not tested)  LOWER EXTREMITY MMT:  MMT Right eval Left eval  Hip flexion    Hip extension    Hip abduction    Hip adduction    Hip internal rotation    Hip external rotation    Knee flexion    Knee extension    Ankle dorsiflexion    Ankle plantarflexion    Ankle inversion    Ankle eversion     PALPATION:   General  tight lumbar, gluteals, hamstrings                External Perineal Exam normal                             Internal Pelvic Floor tight and tender to palpation obturator and ileococcygeus, difficulty relaxing  Patient confirms identification and approves PT to assess internal pelvic floor and treatment Yes  PELVIC MMT:   MMT eval  Vaginal 1/5 x 5 reps, 2 sec holding and difficulty relaxing  Internal Anal Sphincter   External Anal Sphincter   Puborectalis   Diastasis Recti 2 fingers  (Blank rows = not tested)        TONE: high  PROLAPSE: None observed  TODAY'S TREATMENT:  DATE:   04/06/23  Patient confirms identification and approves physical therapist to perform internal soft tissue work - observed region around introitus; unable to see or feel anything lumpy, but  more white coloring where hymen remnants skin is located - skin in tact and normal elasticity, no report of pain with palpation SMT to bil levators and ileococcygeus, bil obturator some tenderness Exercises:  Hip rotation in supine Happy baby stretch and breathing  04/13/23  Therapeutic exercise: Butterfly stretch 2x1 min  Single knee to chest 2x1 min each 2x10 TA activations - support at pubic symphysis  Habby baby 2x45min Hip IR stretch 2x30s each Green yoga ball sitting pelvic drops x10 and x10 diaphragmatic breathes Childs pose 2x1 mins Trunk rotation childs pose 2x1 min each  PATIENT EDUCATION:   Education details: Access Code: [email or web address] Person educated: Patient Education method: Programmer, multimedia, Facilities manager, Verbal cues, and Handouts Education comprehension: verbalized understanding  HOME EXERCISE PROGRAM: Access Code: [email or web address] URL: [email or web address] Date: 02/20/2023 Prepared by: Dwana Curd  Exercises - Seated Hamstring Stretch  - 1 x daily - 7 x weekly - 1 sets - 3 reps - 30 sec hold - Seated Piriformis Stretch with Trunk Bend  - 1 x daily - 7 x weekly - 1 sets - 3 reps - 30 sec hold - Supine Piriformis Stretch  - 1 x daily - 7 x weekly - 1 sets - 3 reps - 30 sec hold - Diaphragmatic Breathing in Child's Pose with Pelvic Floor Relaxation  - 1 x daily - 7 x weekly - 1 sets - 5 reps - 30 sec hold  ASSESSMENT:  CLINICAL IMPRESSION: Today's session focused on  stretches with breathing to promote improved tissue mobility and decreased pain. Pt tolerated well Pt will benefit from skilled PT to address impairments and muscle imbalnces for improved function and pain management.  OBJECTIVE IMPAIRMENTS: decreased coordination, decreased endurance, decreased ROM, decreased strength, increased muscle spasms, impaired flexibility, impaired tone, postural dysfunction, and pain.   ACTIVITY LIMITATIONS:  pain with everything but is not limiting  PARTICIPATION LIMITATIONS: interpersonal relationship  PERSONAL FACTORS: 1 comorbidity: 3 vaginal deliveries  are also affecting patient's functional outcome.   REHAB POTENTIAL: Excellent  CLINICAL DECISION MAKING: Evolving/moderate complexity  EVALUATION COMPLEXITY: Moderate   GOALS: Goals reviewed with patient? Yes  SHORT TERM GOALS: Target date: 03/20/23  Ind with initial HEP Baseline: Goal status: MET    LONG TERM GOALS: Target date: 05/15/23  Pt will be independent with advanced HEP to maintain improvements made throughout therapy  Baseline:  Goal status: IN PROGRESS  2.  Pt will report 75% reduction  of pain due to improvements in posture, strength, and muscle length  Baseline:  Goal status: IN PROGRESS  3.  Pt will have 0/3 score of Marinoff scale  Baseline:  Goal status: IN PROGRESS    PLAN:  PT FREQUENCY: 1x/week  PT DURATION: 12 weeks  PLANNED INTERVENTIONS: Therapeutic exercises, Therapeutic activity, Neuromuscular re-education, Balance training, Gait training, Patient/Family education, Self Care, Joint mobilization, Aquatic Therapy, Dry Needling, Electrical stimulation, Cryotherapy, Moist heat, scar mobilization, Taping, Biofeedback, Manual therapy, and Re-evaluation  PLAN FOR NEXT SESSION: Manual to lumbar, gluteals, stretches for increased flexibility, core strength   Barbaraann Faster, PT 04/13/2023, 12:32 PM

## 2023-04-21 ENCOUNTER — Ambulatory Visit (INDEPENDENT_AMBULATORY_CARE_PROVIDER_SITE_OTHER): Payer: Self-pay | Admitting: Sports Medicine

## 2023-04-21 VITALS — BP 100/62 | Ht 59.0 in | Wt 128.0 lb

## 2023-04-21 DIAGNOSIS — M25511 Pain in right shoulder: Secondary | ICD-10-CM

## 2023-04-21 DIAGNOSIS — G8929 Other chronic pain: Secondary | ICD-10-CM

## 2023-04-21 NOTE — Progress Notes (Signed)
  Harrie Jeans - 32 y.o. female MRN [id number]  Date of birth: [birth year]    CHIEF COMPLAINT:   R Shoulder Pain    SUBJECTIVE:   HPI:  This patient visit was aided by the use of an in person Spanish interpreter  Pleasant 32 year old female comes to clinic to follow-up for right shoulder pain.  She is continuing to have some anterior and lateral shoulder pain/burning.  It is maybe a little bit better.  She says it is made worse when she holds her baby to breast-feed.  She briefly tried to do some of the exercises that we gave her from last time and they made the pain worse so she stopped.  She has not tried any topical Voltaren yet as she just picked up the tube of medicine yesterday.  She denies any numbness or tingling all the way down the arm.  ROS:     See HPI  PERTINENT  PMH / PSH FH / / SH:  Past Medical, Surgical, Social, and Family History Reviewed & Updated in the EMR.  Pertinent findings include:  none  OBJECTIVE: BP 100/62   Ht 4\' 11"  (1.499 m)   Wt 128 lb (58.1 kg)   BMI 25.85 kg/m   Physical Exam:  Vital signs are reviewed.  GEN: Alert and oriented, NAD Pulm: Breathing unlabored PSY: normal mood, congruent affect  MSK: Right shoulder - no obvious deformity.  No overlying skin changes.  Nontender palpation of the AC joint.  Mildly tender palpation biceps tendon in the bicipital groove.  Full range of motion of shoulder in all directions.  5/5 strength throughout cuff testing.  Negative Hawkins test.  Positive empty can test.  Positive O'Brien's test.  Negative speeds test.  Negative posterior load-and-shift test.  Negative apprehension test.  Neurovascular intact distally  ASSESSMENT & PLAN:  1.  Right shoulder pain  -Discussed with the patient that she likely has a component of cuff tendinitis from overuse in the setting of increased lifting with a baby.  She says the pain is pretty tolerable and she has not yet tried the topical Voltaren cream. She wants to  try that first before trying a corticosteroid injection.  I gave her rotator cuff rehab home exercises with resistance bands to start to strengthen her cuff.  Will check on this in about 4 to 6 weeks.  If no improvement then I would lean more towards the subacromial corticosteroid injection to try to get her more relief.   Arvella Nigh, MD PGY-4, Sports Medicine Fellow [facility]  Addendum:  I was the preceptor for this visit and available for immediate consultation.  Norton Blizzard MD Marrianne Mood

## 2023-05-04 ENCOUNTER — Ambulatory Visit: Payer: No Typology Code available for payment source | Admitting: Physical Therapy

## 2023-05-04 DIAGNOSIS — M6281 Muscle weakness (generalized): Secondary | ICD-10-CM

## 2023-05-04 DIAGNOSIS — R293 Abnormal posture: Secondary | ICD-10-CM

## 2023-05-04 NOTE — Therapy (Signed)
OUTPATIENT PHYSICAL THERAPY FEMALE PELVIC TREATMENT   Patient Name: Michele Stephens MRN: [id number] DOB:September 09, 1991, 32 y.o., female Today's Date: 05/04/2023  END OF SESSION:  PT End of Session - 05/04/23 0935     Visit Number 4    Date for PT Re-Evaluation 05/15/23    Authorization Type generic commercial    PT Start Time 0935    PT Stop Time 1015    PT Time Calculation (min) 40 min    Activity Tolerance Patient tolerated treatment well    Behavior During Therapy [facility] for tasks assessed/performed               Past Medical History:  Diagnosis Date   Gestational diabetes    Past Surgical History:  Procedure Laterality Date   NO PAST SURGERIES     Patient Active Problem List   Diagnosis Date Noted   Pain of female symphysis pubis 02/13/2023   Pain of right breast 01/23/2023   Language barrier 05/18/2022   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    PCP: Quita Skye, PA-C   REFERRING PROVIDER: Latrelle Dodrill, MD   REFERRING DIAG: N94.9 (ICD-10-CM) - Pain of female symphysis pubis   THERAPY DIAG:  Abnormal posture  Muscle weakness (generalized)  Rationale for Evaluation and Treatment: Rehabilitation  ONSET DATE: October 2023  SUBJECTIVE:                                                                                                                                                                                           SUBJECTIVE STATEMENT: Pt feels a little better. Pt is drinking more water.   Fluid intake: Yes: 4 - 5 bottles of water    PAIN:  Are you having pain? Yes NPRS scale: 5/10 Pain location: Anterior and pelvis interior  Pain type: burning Pain description: intermittent   Aggravating factors: intercourse Relieving factors: green shakes, not having intercourse  PRECAUTIONS: None  WEIGHT BEARING RESTRICTIONS: No  FALLS:  Has patient fallen in last 6 months? No  LIVING ENVIRONMENT: Lives with: lives with their family and 3  children Lives in: House/apartment   OCCUPATION: not working  PLOF: Independent  PATIENT GOALS: intercourse without pain and not have pain in general  PERTINENT HISTORY:  3 vaginal deliveries Sexual abuse: No  BOWEL MOVEMENT: Pain with bowel movement: No Normal bowel movements  URINATION: Pain with urination: No Fully empty bladder: Yes:   Stream: Strong Urgency: No Frequency: normal, nocturia 2-3x Leakage:  no Pads: No  INTERCOURSE: Pain with intercourse: Initial Penetration, During Penetration, and After Intercourse lasts 2 days Ability to have vaginal penetration:  Yes: but  not having due to pain Climax:  Marinoff Scale: 3/3  PREGNANCY: Vaginal deliveries 3 Tearing Yes: 1st one yes C-section deliveries  Currently pregnant : no  PROLAPSE: None   OBJECTIVE:   DIAGNOSTIC FINDINGS:    PATIENT SURVEYS:    PFIQ-7   COGNITION: Overall cognitive status: Within functional limits for tasks assessed     SENSATION: Light touch: Appears intact Proprioception: Appears intact  MUSCLE LENGTH: Hamstrings: Right 50 deg; Left 50 deg Thomas test:   LUMBAR SPECIAL TESTS:  ASLR - improved with core and lumbar support  FUNCTIONAL TESTS:  Single leg WFL   GAIT:  Comments: WFL   POSTURE:  Rt rotation lumbar and posterior ilium on the Rt in standing  PELVIC ALIGNMENT:  LUMBARAROM/PROM:  A/PROM A/PROM  eval  Flexion 75%  Extension   Right lateral flexion   Left lateral flexion   Right rotation   Left rotation    (Blank rows = not tested)  LOWER EXTREMITY ROM:  Passive ROM Right eval Left eval  Hip flexion    Hip extension    Hip abduction    Hip adduction    Hip internal rotation    Hip external rotation 50% 50%  Knee flexion    Knee extension    Ankle dorsiflexion    Ankle plantarflexion    Ankle inversion    Ankle eversion     (Blank rows = not tested)  LOWER EXTREMITY MMT:  MMT Right eval Left eval  Hip flexion    Hip  extension    Hip abduction    Hip adduction    Hip internal rotation    Hip external rotation    Knee flexion    Knee extension    Ankle dorsiflexion    Ankle plantarflexion    Ankle inversion    Ankle eversion     PALPATION:   General  tight lumbar, gluteals, hamstrings                External Perineal Exam normal                             Internal Pelvic Floor tight and tender to palpation obturator and ileococcygeus, difficulty relaxing  Patient confirms identification and approves PT to assess internal pelvic floor and treatment Yes  PELVIC MMT:   MMT eval  Vaginal 1/5 x 5 reps, 2 sec holding and difficulty relaxing  Internal Anal Sphincter   External Anal Sphincter   Puborectalis   Diastasis Recti 2 fingers  (Blank rows = not tested)        TONE: high  PROLAPSE: None observed  TODAY'S TREATMENT:                                                                                                                              DATE:   04/06/23  Patient confirms identification and approves physical therapist  to perform internal soft tissue work - observed region around introitus; unable to see or feel anything lumpy, but  more white coloring where hymen remnants skin is located - skin in tact and normal elasticity, no report of pain with palpation SMT to bil levators and ileococcygeus, bil obturator some tenderness Exercises:  Hip rotation in supine Happy baby stretch and breathing  04/13/23  Therapeutic exercise: Butterfly stretch 2x1 min  Single knee to chest 2x1 min each 2x10 TA activations - support at pubic symphysis  Habby baby 2x65min Hip IR stretch 2x30s each Green yoga ball sitting pelvic drops x10 and x10 diaphragmatic breathes Childs pose 2x1 mins Trunk rotation childs pose 2x1 min each  05/04/23 Manual: Adductors, gluteals, lumbar paraspinals Trigger Point Dry-Needling  Treatment instructions: Expect mild to moderate muscle soreness. S/S of  pneumothorax if dry needled over a lung field, and to seek immediate medical attention should they occur. Patient verbalized understanding of these instructions and education.  Patient Consent Given: Yes Education handout provided: Yes verbally explained Muscles treated: bil gluteal and piriformis, lumbar multifidi L5 bil Electrical stimulation performed: No Parameters: N/A Treatment response/outcome: increased soft tissue length palpated Patient confirms identification and approves physical therapist to perform internal soft tissue work  - left and right levators, obturators and coccygeus  PATIENT EDUCATION:  Education details: Access Code: [email or web address] Person educated: Patient Education method: Programmer, multimedia, Facilities manager, Verbal cues, and Handouts Education comprehension: verbalized understanding  HOME EXERCISE PROGRAM: Access Code: [email or web address] URL: [email or web address] Date: 02/20/2023 Prepared by: Dwana Curd  Exercises - Seated Hamstring Stretch  - 1 x daily - 7 x weekly - 1 sets - 3 reps - 30 sec hold - Seated Piriformis Stretch with Trunk Bend  - 1 x daily - 7 x weekly - 1 sets - 3 reps - 30 sec hold - Supine Piriformis Stretch  - 1 x daily - 7 x weekly - 1 sets - 3 reps - 30 sec hold - Diaphragmatic Breathing in Child's Pose with Pelvic Floor Relaxation  - 1 x daily - 7 x weekly - 1 sets - 5 reps - 30 sec hold  ASSESSMENT:  CLINICAL IMPRESSION: Today's session focused on increased soft tissue length.  Pt has reduced pain with all manual modalities and is doing better overall.  Pt is recommended to return in one week if possible to f/u with dry needling and progress soft tissue lengthening and continue to add core strength.  Pt tolerated well Pt will benefit from skilled PT to address impairments and muscle imbalnces for improved function and pain management.  OBJECTIVE IMPAIRMENTS: decreased coordination, decreased endurance, decreased ROM, decreased  strength, increased muscle spasms, impaired flexibility, impaired tone, postural dysfunction, and pain.   ACTIVITY LIMITATIONS:  pain with everything but is not limiting  PARTICIPATION LIMITATIONS: interpersonal relationship  PERSONAL FACTORS: 1 comorbidity: 3 vaginal deliveries  are also affecting patient's functional outcome.   REHAB POTENTIAL: Excellent  CLINICAL DECISION MAKING: Evolving/moderate complexity  EVALUATION COMPLEXITY: Moderate   GOALS: Goals reviewed with patient? Yes  SHORT TERM GOALS: Target date: 03/20/23  Ind with initial HEP Baseline: Goal status: MET    LONG TERM GOALS: Target date: 05/15/23  Pt will be independent with advanced HEP to maintain improvements made throughout therapy  Baseline:  Goal status: IN PROGRESS  2.  Pt will report 75% reduction of pain due to improvements in posture, strength, and muscle length  Baseline:  Goal status: IN PROGRESS  3.  Pt will have 0/3 score of Marinoff  scale  Baseline:  Goal status: IN PROGRESS    PLAN:  PT FREQUENCY: 1x/week  PT DURATION: 12 weeks  PLANNED INTERVENTIONS: Therapeutic exercises, Therapeutic activity, Neuromuscular re-education, Balance training, Gait training, Patient/Family education, Self Care, Joint mobilization, Aquatic Therapy, Dry Needling, Electrical stimulation, Cryotherapy, Moist heat, scar mobilization, Taping, Biofeedback, Manual therapy, and Re-evaluation  PLAN FOR NEXT SESSION: f/u on dry needling Manual to lumbar, gluteals, stretches for increased flexibility, add core strength   Jakki L Paulette [place]ford, PT 05/04/2023, 11:03 AM

## 2023-05-19 ENCOUNTER — Ambulatory Visit (INDEPENDENT_AMBULATORY_CARE_PROVIDER_SITE_OTHER): Payer: Self-pay | Admitting: Sports Medicine

## 2023-05-19 VITALS — BP 102/62 | Ht 59.0 in | Wt 120.0 lb

## 2023-05-19 DIAGNOSIS — M25511 Pain in right shoulder: Secondary | ICD-10-CM

## 2023-05-19 MED ORDER — PREDNISONE 10 MG PO TABS: ORAL_TABLET | ORAL | 0 refills | Status: DC

## 2023-05-19 NOTE — Progress Notes (Unsigned)
PCP: Quita Skye, PA-C  Subjective:   HPI: Patient is a 32 y.o. female here for R shoulder pain.  Previously seen on 5/10 with some concern for rotator cuff tendinitis. since last visit, she has been using Voltaren gel and performing rotator cuff exercises daily with some relief, but she still describes a persistent burning sensation between her right lateral chest and right shoulder.  She says that this pain is worse when she is breast-feeding, but occasionally is present when she is not breast-feeding as well.  She denies any pain around the breast or nipple, rashes, pain along her lateral shoulder, arm, or forearm, or any weakness or numbness.  She possibly has a history of chickenpox.  She has been taking turmeric as an anti-inflammatory and this seems to help.  Current Outpatient Medications on File Prior to Visit  Medication Sig Dispense Refill   Evening Primrose Oil 1000 MG CAPS Take 4,000 mg by mouth in the morning and at bedtime.     Evening Primrose Oil 500 MG CAPS Take 4 capsules by mouth 2 (two) times daily.     Multiple Vitamin (MULTIVITAMIN) capsule Take 1 capsule by mouth daily.     Norethindrone Acetate-Ethinyl Estrad-FE (LOESTRIN 24 FE) 1-20 MG-MCG(24) tablet Take 1 tablet by mouth daily. 84 tablet 4   No current facility-administered medications on file prior to visit.   Objective:  Physical Exam:  Gen: NAD, comfortable in exam room Neck: Negative Spurling's test Chest: No visible rashes, erythema or warmth around the right chest wall.  No tenderness to palpation.  Right shoulder: No deformities, erythema, edema or warmth.  No rashes.  Full AROM and PROM.  5/5 strength abduction, internal and external rotation.  No reproducible pain with resistance on internal and external rotation.  Mild pain with empty can testing, mild pain with liftoff test with resistance.  Negative crossarm.  RUE: No pain with elbow flexion against resistance, forearm supination against  resistance.  Negative Tinel's.   Assessment & Plan:  1.  Right shoulder pain: Patient comes in for follow-up of burning right shoulder, chest wall pain.  Unclear if this is rotator cuff etiology, could have some component of tendinitis or myositis.  No rashes today.  Less likely to be neuropathic with negative Spurling test, and no neuropathic findings on the distal right arm, IV focal peripheral neuropathic impingement.  Not likely lactational mastitis without rash or historical symptoms from breast or nipple.  Suggested prednisone 6-day Dosepak as a trial of anti-inflammatory.  She wants to continue trying turmeric first for about a week as this has been helping little bit already.  If this does not resolve her symptoms, she will pick up the prednisone taper from the pharmacy and then take that.  Follow-up as needed.  FELLOW ATTESTATION: I personally valuated the patient with the resident and agree with the above documentation with the following emphasis: Pain is a little atypical.  He does seem to be in the distribution over the biceps.  There may be a component of skin irritation with breast-feeding.  I think it is reasonable for her to try turmeric and if no improvement can try a prednisone Dosepak.  She can follow-up as needed.  Baldemar Friday Rafoth 05/19/2023  I was the preceptor for this visit and available for immediate consultation Marsa Aris, DO

## 2023-05-19 NOTE — Patient Instructions (Addendum)
For the prednisone take it as follows: 6 pills by mouth day 1 5 pills by mouth day 2 4 pills by mouth day 3 3 pills by mouth day 4 2 pills by mouth day 5 1 pill by mouth day 6  Para la prednisona, tmela de la siguiente manera: 6 pastillas por va oral el da 1 5 pastillas por va oral da 2 4 pastillas por va oral el da 3 3 pastillas por va oral da 4 2 pastillas por va oral el da 5 1 pastilla por va oral el da 6

## 2023-05-23 ENCOUNTER — Encounter: Payer: Self-pay | Admitting: Physical Therapy

## 2023-05-23 ENCOUNTER — Ambulatory Visit: Payer: Self-pay | Attending: Family Medicine | Admitting: Physical Therapy

## 2023-05-23 DIAGNOSIS — R293 Abnormal posture: Secondary | ICD-10-CM | POA: Insufficient documentation

## 2023-05-23 DIAGNOSIS — M6281 Muscle weakness (generalized): Secondary | ICD-10-CM | POA: Insufficient documentation

## 2023-05-23 NOTE — Therapy (Signed)
OUTPATIENT PHYSICAL THERAPY FEMALE PELVIC TREATMENT   Patient Name: Michele Stephens MRN: [id number] DOB:1991-08-16, 32 y.o., female Today's Date: 05/23/2023  END OF SESSION:  PT End of Session - 05/23/23 1429     Visit Number 5    Date for PT Re-Evaluation 07/18/23    Authorization Type generic commercial    PT Start Time 1153    PT Stop Time 1234    PT Time Calculation (min) 41 min    Activity Tolerance Patient tolerated treatment well    Behavior During Therapy [facility] for tasks assessed/performed                Past Medical History:  Diagnosis Date   Gestational diabetes    Past Surgical History:  Procedure Laterality Date   NO PAST SURGERIES     Patient Active Problem List   Diagnosis Date Noted   Pain of female symphysis pubis 02/13/2023   Pain of right breast 01/23/2023   Language barrier 05/18/2022   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  After intercourse pain is much stronger. Sometimes pain correlates to pain in Rt shoulder - both feel like burning. Fluid intake: Yes: 4 - 5 bottles of water    PAIN:  Are you having pain? Yes NPRS scale: 2/10 Pain location: Anterior and pelvis interior  Pain type: burning Pain description: intermittent   Aggravating factors: intercourse Relieving factors: green shakes, not having intercourse  PRECAUTIONS: None  WEIGHT BEARING RESTRICTIONS:  No  FALLS:  Has patient fallen in last 6 months? No  LIVING ENVIRONMENT: Lives with: lives with their family and 3 children Lives in: House/apartment   OCCUPATION: not working  PLOF: Independent  PATIENT GOALS: intercourse without pain and not have pain in general  PERTINENT HISTORY:  3 vaginal deliveries Sexual abuse: No  BOWEL MOVEMENT: Pain with bowel movement: No Normal bowel movements  URINATION: Pain with urination: No Fully empty bladder: Yes:   Stream: Strong Urgency: No Frequency: normal, nocturia 2-3x Leakage:  no Pads: No  INTERCOURSE: Pain with intercourse:  Initial Penetration, During Penetration, and After Intercourse lasts 2 days Ability to have vaginal penetration:  Yes: but not having due to pain Climax:  Marinoff Scale: 3/3  PREGNANCY: Vaginal deliveries 3 Tearing Yes: 1st one yes C-section deliveries  Currently pregnant : no  PROLAPSE: None   OBJECTIVE:   DIAGNOSTIC FINDINGS:    PATIENT SURVEYS:    PFIQ-7   COGNITION: Overall cognitive status: Within functional limits for tasks assessed     SENSATION: Light touch: Appears intact Proprioception: Appears intact  MUSCLE LENGTH: Hamstrings: Right 50 deg; Left 50 deg Thomas test:   LUMBAR SPECIAL TESTS:  ASLR - improved with core and lumbar support  FUNCTIONAL TESTS:  Single leg WFL   GAIT:  Comments: WFL   POSTURE:  Rt rotation lumbar and posterior ilium on the Rt in standing  PELVIC ALIGNMENT:  LUMBARAROM/PROM:  A/PROM A/PROM  eval  Flexion 75%  Extension   Right lateral flexion   Left lateral flexion   Right rotation   Left rotation    (Blank rows = not tested)  LOWER EXTREMITY ROM:  Passive ROM Right eval Left eval  Hip flexion    Hip extension    Hip abduction    Hip adduction    Hip internal rotation    Hip external rotation 50% 50%  Knee flexion    Knee extension    Ankle dorsiflexion    Ankle plantarflexion    Ankle inversion    Ankle  eversion     (Blank rows = not tested)  LOWER EXTREMITY MMT:  MMT Right eval Left eval  Hip flexion    Hip extension    Hip abduction    Hip adduction    Hip internal rotation    Hip external rotation    Knee flexion    Knee extension    Ankle dorsiflexion    Ankle plantarflexion    Ankle inversion    Ankle eversion     PALPATION:   General  tight lumbar, gluteals, hamstrings                External Perineal Exam normal                             Internal Pelvic Floor tight and tender to palpation obturator and ileococcygeus, difficulty relaxing  Patient confirms identification and approves PT to assess internal pelvic floor and treatment Yes  PELVIC MMT:   MMT eval  Vaginal 1/5 x 5 reps, 2 sec holding and difficulty relaxing  Internal Anal Sphincter   External Anal Sphincter   Puborectalis   Diastasis Recti 2 fingers  (Blank rows = not tested)        TONE: high  PROLAPSE: None observed  TODAY'S TREATMENT:  DATE:   04/13/23  Therapeutic exercise: Butterfly stretch 2x1 min  Single knee to chest 2x1 min each 2x10 TA activations - support at pubic symphysis  Habby baby 2x38min Hip IR stretch 2x30s each Green yoga ball sitting pelvic drops x10 and x10 diaphragmatic breathes Childs pose 2x1 mins Trunk rotation childs pose 2x1 min each  05/04/23 Manual: Adductors, gluteals, lumbar paraspinals Trigger Point Dry-Needling  Treatment instructions: Expect mild to moderate muscle soreness. S/S of pneumothorax if dry needled over a lung field, and to seek immediate medical attention should they occur. Patient verbalized understanding of these instructions and education.  Patient Consent Given: Yes Education handout provided: Yes verbally explained Muscles treated: bil gluteal and piriformis, lumbar multifidi L5 bil Electrical stimulation  performed: No Parameters: N/A Treatment response/outcome: increased soft tissue length palpated Patient confirms identification and approves physical therapist to perform internal soft tissue work  - left and right levators, obturators and coccygeus  05/23/23 MMT Rt shoulder UE flex and abduction with some "discomfort" and 4/5  Open book and thoracic ext Manual Patient confirms identification and approves physical therapist to perform internal soft tissue work  - left and right levators, obturators and coccygeus Theract: Dilator usage  PATIENT EDUCATION:  Education details: Access Code: [email or web address] Person educated: Patient Education method: Programmer, multimedia, Facilities manager, Verbal cues, and Handouts Education comprehension: verbalized understanding  HOME EXERCISE PROGRAM: Access Code: [email or web address] URL: [email or web address] Date: 02/20/2023 Prepared by: Dwana Curd  Exercises - Seated Hamstring Stretch  - 1 x daily - 7 x weekly - 1 sets - 3 reps - 30 sec hold - Seated Piriformis Stretch with Trunk Bend  - 1 x daily - 7 x weekly - 1 sets - 3 reps - 30 sec hold - Supine Piriformis Stretch  - 1 x daily - 7 x weekly - 1 sets - 3 reps - 30 sec hold - Diaphragmatic Breathing in Child's Pose with Pelvic Floor Relaxation  - 1 x daily - 7 x weekly - 1 sets - 5 reps - 30 sec hold  ASSESSMENT:  CLINICAL IMPRESSION: Pt continues to have pain but is improved to where she can have intercourse.  Pt has trigger points and tension in obturators, thoracolumbar spine, Rt pecs.  Pt has increased thoracic kyphosis.  Pt has made progress with visits, but progressing slowly due to spaced out visits.  Pt is expected to continue to make progress and pt will benefit from skilled PT to address impairments and muscle imbalnces for improved function and pain management.  OBJECTIVE IMPAIRMENTS: decreased coordination, decreased endurance, decreased ROM, decreased strength, increased muscle spasms,  impaired flexibility, impaired tone, postural dysfunction, and pain.   ACTIVITY LIMITATIONS:  pain with everything but is not limiting  PARTICIPATION LIMITATIONS: interpersonal relationship  PERSONAL FACTORS: 1 comorbidity: 3 vaginal deliveries  are also affecting patient's functional outcome.   REHAB POTENTIAL: Excellent  CLINICAL DECISION MAKING: Evolving/moderate complexity  EVALUATION COMPLEXITY: Moderate   GOALS: Goals reviewed with patient? Yes  SHORT TERM GOALS: Target date: 03/20/23  Ind with initial HEP Baseline: Goal status: MET    LONG TERM GOALS: Target date: 07/18/23 Updated 05/23/23  Pt will be independent with advanced HEP to maintain improvements made throughout therapy  Baseline:  Goal status: IN PROGRESS  2.  Pt will report 75% reduction of pain due to improvements in posture, strength, and muscle length  Baseline:  Goal status: IN PROGRESS  3.  Pt will have 0/3 score of Marinoff scale  Baseline:  Goal status: IN PROGRESS  PLAN:  PT FREQUENCY: 1x/week  PT DURATION: 12 weeks  PLANNED INTERVENTIONS: Therapeutic exercises, Therapeutic activity, Neuromuscular re-education, Balance training, Gait training, Patient/Family education, Self Care, Joint mobilization, Aquatic Therapy, Dry Needling, Electrical stimulation, Cryotherapy, Moist heat, scar mobilization, Taping, Biofeedback, Manual therapy, and Re-evaluation  PLAN FOR NEXT SESSION: f/u on pain with intercourse, thoracic mobility and scap strength for improved posture, internal soft tissue bil if still having pain   Brayton Caves Markia Kyer, PT 05/23/2023, 3:32 PM

## 2023-05-30 ENCOUNTER — Ambulatory Visit: Payer: Self-pay | Admitting: Physical Therapy

## 2023-05-30 ENCOUNTER — Encounter: Payer: Self-pay | Admitting: Physical Therapy

## 2023-05-30 DIAGNOSIS — M6281 Muscle weakness (generalized): Secondary | ICD-10-CM

## 2023-05-30 DIAGNOSIS — R293 Abnormal posture: Secondary | ICD-10-CM

## 2023-05-30 NOTE — Therapy (Signed)
OUTPATIENT PHYSICAL THERAPY FEMALE PELVIC TREATMENT   Patient Name: Michele Stephens MRN: [id number] DOB:Jan 16, 1991, 32 y.o., female Today's Date: 05/30/2023  END OF SESSION:  PT End of Session - 05/30/23 1153     Visit Number 6    Date for PT Re-Evaluation 07/18/23    Authorization Type generic commercial    PT Start Time 1151    PT Stop Time 1229    PT Time Calculation (min) 38 min    Activity Tolerance Patient tolerated treatment well    Behavior During Therapy [facility] for tasks assessed/performed                 Past Medical History:  Diagnosis Date   Gestational diabetes    Past Surgical History:  Procedure Laterality Date   NO PAST SURGERIES     Patient Active Problem List   Diagnosis Date Noted   Pain of female symphysis pubis 02/13/2023   Pain of right breast 01/23/2023   Language barrier 05/18/2022   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    PCP: Quita Skye, PA-C   REFERRING PROVIDER: Latrelle Dodrill, MD   REFERRING DIAG: N94.9 (ICD-10-CM) - Pain of female symphysis pubis   THERAPY DIAG:  Abnormal posture  Muscle weakness (generalized)  Rationale for Evaluation and Treatment: Rehabilitation  ONSET DATE: October 2023  SUBJECTIVE:                                                                                                                                                                                           SUBJECTIVE STATEMENT: Pt still has a little burning sensation in pubic bone and intercourse is a little better but overall same since last time. Fluid intake: Yes: 4 - 5 bottles of water    PAIN:  Are you having pain? Yes NPRS scale: 3/10 Pain location: Anterior and pelvis interior  Pain type: burning Pain description: intermittent   Aggravating factors: intercourse Relieving factors: green shakes, not having intercourse  PRECAUTIONS: None  WEIGHT BEARING RESTRICTIONS: No  FALLS:  Has patient fallen in last 6  months? No  LIVING ENVIRONMENT: Lives with: lives with their family and 3 children Lives in: House/apartment   OCCUPATION: not working  PLOF: Independent  PATIENT GOALS: intercourse without pain and not have pain in general  PERTINENT HISTORY:  3 vaginal deliveries Sexual abuse: No  BOWEL MOVEMENT: Pain with bowel movement: No Normal bowel movements  URINATION: Pain with urination: No Fully empty bladder: Yes:   Stream: Strong Urgency: No Frequency: normal, nocturia 2-3x Leakage:  no Pads: No  INTERCOURSE: Pain with intercourse: Initial Penetration, During Penetration, and After  Intercourse lasts 2 days Ability to have vaginal penetration:  Yes: but not having due to pain Climax:  Marinoff Scale: 3/3  PREGNANCY: Vaginal deliveries 3 Tearing Yes: 1st one yes C-section deliveries  Currently pregnant : no  PROLAPSE: None   OBJECTIVE:   DIAGNOSTIC FINDINGS:    PATIENT SURVEYS:    PFIQ-7   COGNITION: Overall cognitive status: Within functional limits for tasks assessed     SENSATION: Light touch: Appears intact Proprioception: Appears intact  MUSCLE LENGTH: Hamstrings: Right 50 deg; Left 50 deg Thomas test:   LUMBAR SPECIAL TESTS:  ASLR - improved with core and lumbar support  FUNCTIONAL TESTS:  Single leg WFL   GAIT:  Comments: WFL   POSTURE:  Rt rotation lumbar and posterior ilium on the Rt in standing  PELVIC ALIGNMENT:  LUMBARAROM/PROM:  A/PROM A/PROM  eval  Flexion 75%  Extension   Right lateral flexion   Left lateral flexion   Right rotation   Left rotation    (Blank rows = not tested)  LOWER EXTREMITY ROM:  Passive ROM Right eval Left eval  Hip flexion    Hip extension    Hip abduction    Hip adduction    Hip internal rotation    Hip external rotation 50% 50%  Knee flexion    Knee extension    Ankle dorsiflexion    Ankle plantarflexion    Ankle inversion    Ankle eversion     (Blank rows = not  tested)  LOWER EXTREMITY MMT:  MMT Right eval Left eval  Hip flexion    Hip extension    Hip abduction    Hip adduction    Hip internal rotation    Hip external rotation    Knee flexion    Knee extension    Ankle dorsiflexion    Ankle plantarflexion    Ankle inversion    Ankle eversion     PALPATION:   General  tight lumbar, gluteals, hamstrings                External Perineal Exam normal                             Internal Pelvic Floor tight and tender to palpation obturator and ileococcygeus, difficulty relaxing  Patient confirms identification and approves PT to assess internal pelvic floor and treatment Yes  PELVIC MMT:   MMT eval  Vaginal 1/5 x 5 reps, 2 sec holding and difficulty relaxing  Internal Anal Sphincter   External Anal Sphincter   Puborectalis   Diastasis Recti 2 fingers  (Blank rows = not tested)        TONE: high  PROLAPSE: None observed  TODAY'S TREATMENT:  DATE:     05/04/23 Manual: Adductors, gluteals, lumbar paraspinals Trigger Point Dry-Needling  Treatment instructions: Expect mild to moderate muscle soreness. S/S of pneumothorax if dry needled over a lung field, and to seek immediate medical attention should they occur. Patient verbalized understanding of these instructions and education.  Patient Consent Given: Yes Education handout provided: Yes verbally explained Muscles treated: bil gluteal and piriformis, lumbar multifidi L5 bil Electrical stimulation performed: No Parameters: N/A Treatment response/outcome: increased soft tissue length palpated Patient confirms identification and approves physical therapist to perform internal soft tissue work  - left and right levators, obturators and coccygeus  05/23/23 MMT Rt shoulder UE flex and abduction with some "discomfort" and 4/5  Open book and thoracic  ext Manual Patient confirms identification and approves physical therapist to perform internal soft tissue work  - left and right levators, obturators and coccygeus Theract: Dilator usage  05/30/23 Manual: Adductors, gluteals, lumbar paraspinals Trigger Point Dry-Needling  Treatment instructions: Expect mild to moderate muscle soreness. S/S of pneumothorax if dry needled over a lung field, and to seek immediate medical attention should they occur. Patient verbalized understanding of these instructions and education.  Patient Consent Given: Yes Education handout provided: Yes verbally explained Muscles treated: bil adductors and  Electrical stimulation performed: No Parameters: N/A Treatment response/outcome: increased soft tissue length palpated Patient confirms identification and approves physical therapist to perform internal soft tissue work  - left and right levators, obturators and coccygeus  PATIENT EDUCATION:  Education details: Access Code: [email or web address] Person educated: Patient Education method: Programmer, multimedia, Facilities manager, Verbal cues, and Handouts Education comprehension: verbalized understanding  HOME EXERCISE PROGRAM: Access Code: [email or web address] URL: [email or web address] Date: 02/20/2023 Prepared by: Dwana Curd  Exercises - Seated Hamstring Stretch  - 1 x daily - 7 x weekly - 1 sets - 3 reps - 30 sec hold - Seated Piriformis Stretch with Trunk Bend  - 1 x daily - 7 x weekly - 1 sets - 3 reps - 30 sec hold - Supine Piriformis Stretch  - 1 x daily - 7 x weekly - 1 sets - 3 reps - 30 sec hold - Diaphragmatic Breathing in Child's Pose with Pelvic Floor Relaxation  - 1 x daily - 7 x weekly - 1 sets - 5 reps - 30 sec hold  ASSESSMENT:  CLINICAL IMPRESSION: Pt continues to have pain but is not changed since previous visit.  Today's session return to focusing on manual techniques since she had good results from this at previous session.  Pt is expected to  continue to make progress and pt will benefit from skilled PT to address impairments and muscle imbalnces for improved function and pain management.  OBJECTIVE IMPAIRMENTS: decreased coordination, decreased endurance, decreased ROM, decreased strength, increased muscle spasms, impaired flexibility, impaired tone, postural dysfunction, and pain.   ACTIVITY LIMITATIONS:  pain with everything but is not limiting  PARTICIPATION LIMITATIONS: interpersonal relationship  PERSONAL FACTORS: 1 comorbidity: 3 vaginal deliveries  are also affecting patient's functional outcome.   REHAB POTENTIAL: Excellent  CLINICAL DECISION MAKING: Evolving/moderate complexity  EVALUATION COMPLEXITY: Moderate   GOALS: Goals reviewed with patient? Yes  SHORT TERM GOALS: Target date: 03/20/23  Ind with initial HEP Baseline: Goal status: MET    LONG TERM GOALS: Target date: 07/18/23 Updated 05/23/23  Pt will be independent with advanced HEP to maintain improvements made throughout therapy  Baseline:  Goal status: IN PROGRESS  2.  Pt will report 75% reduction of pain due to improvements in posture, strength, and  muscle length  Baseline:  Goal status: IN PROGRESS  3.  Pt will have 0/3 score of Marinoff scale  Baseline: a little better Goal status: IN PROGRESS    PLAN:  PT FREQUENCY: 1x/week  PT DURATION: 12 weeks  PLANNED INTERVENTIONS: Therapeutic exercises, Therapeutic activity, Neuromuscular re-education, Balance training, Gait training, Patient/Family education, Self Care, Joint mobilization, Aquatic Therapy, Dry Needling, Electrical stimulation, Cryotherapy, Moist heat, scar mobilization, Taping, Biofeedback, Manual therapy, and Re-evaluation  PLAN FOR NEXT SESSION: f/u on pain with intercourse, thoracic mobility and scap strength for improved posture, internal soft tissue bil if still having pain   Brayton Caves Bricia Taher, PT 05/30/2023, 12:35 PM

## 2023-06-05 NOTE — Therapy (Unsigned)
OUTPATIENT PHYSICAL THERAPY FEMALE PELVIC TREATMENT   Patient Name: Michele Stephens MRN: [id number] DOB:1991-10-02, 32 y.o., female Today's Date: 06/06/2023  END OF SESSION:  PT End of Session - 06/06/23 1151     Visit Number 7    Date for PT Re-Evaluation 07/18/23    Authorization Type generic commercial    PT Start Time 1150    PT Stop Time 1229    PT Time Calculation (min) 39 min    Activity Tolerance Patient tolerated treatment well    Behavior During Therapy [facility] for tasks assessed/performed                  Past Medical History:  Diagnosis Date   Gestational diabetes    Past Surgical History:  Procedure Laterality Date   NO PAST SURGERIES     Patient Active Problem List   Diagnosis Date Noted   Pain of female symphysis pubis 02/13/2023   Pain of right breast 01/23/2023   Language barrier 05/18/2022   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  Overall it feels better with doing soft tissue work and is 50% improved. Fluid intake: Yes: 4 - 5 bottles of water    PAIN:  Are you having pain? Yes NPRS scale: 3/10 Pain location: Anterior and pelvis interior  Pain type: burning Pain description: intermittent   Aggravating factors: intercourse Relieving factors: green shakes, not having intercourse  PRECAUTIONS: None  WEIGHT BEARING RESTRICTIONS: No  FALLS:  Has patient  fallen in last 6 months? No  LIVING ENVIRONMENT: Lives with: lives with their family and 3 children Lives in: House/apartment   OCCUPATION: not working  PLOF: Independent  PATIENT GOALS: intercourse without pain and not have pain in general  PERTINENT HISTORY:  3 vaginal deliveries Sexual abuse: No  BOWEL MOVEMENT: Pain with bowel movement: No Normal bowel movements  URINATION: Pain with urination: No Fully empty bladder: Yes:   Stream: Strong Urgency: No Frequency: normal, nocturia 2-3x Leakage:  no Pads: No  INTERCOURSE: Pain with intercourse:  Initial Penetration, During Penetration, and After Intercourse lasts 2 days Ability to have vaginal penetration:  Yes: but not having due to pain Climax:  Marinoff Scale: 3/3  PREGNANCY: Vaginal deliveries 3 Tearing Yes: 1st one yes C-section deliveries  Currently pregnant : no  PROLAPSE: None   OBJECTIVE:   DIAGNOSTIC FINDINGS:    PATIENT SURVEYS:    PFIQ-7   COGNITION: Overall cognitive status: Within functional limits for tasks assessed     SENSATION: Light touch: Appears intact Proprioception: Appears intact  MUSCLE LENGTH: Hamstrings: Right 50 deg; Left 50 deg Thomas test:   LUMBAR SPECIAL TESTS:  ASLR - improved with core and lumbar support  FUNCTIONAL TESTS:  Single leg WFL   GAIT:  Comments: WFL   POSTURE:  Rt rotation lumbar and posterior ilium on the Rt in standing  PELVIC ALIGNMENT:  LUMBARAROM/PROM:  A/PROM A/PROM  eval  Flexion 75%  Extension   Right lateral flexion   Left lateral flexion   Right rotation   Left rotation    (Blank rows = not tested)  LOWER EXTREMITY ROM:  Passive ROM Right eval Left eval  Hip flexion    Hip extension    Hip abduction    Hip adduction    Hip internal rotation    Hip external rotation 50% 50%  Knee flexion    Knee extension    Ankle dorsiflexion    Ankle plantarflexion    Ankle inversion    Ankle eversion     (Blank rows =  not tested)  LOWER EXTREMITY MMT:  MMT Right eval Left eval  Hip flexion    Hip extension    Hip abduction    Hip adduction    Hip internal rotation    Hip external rotation    Knee flexion    Knee extension    Ankle dorsiflexion    Ankle plantarflexion    Ankle inversion    Ankle eversion     PALPATION:   General  tight lumbar, gluteals, hamstrings                External Perineal Exam normal                             Internal Pelvic Floor tight and tender to palpation obturator and ileococcygeus, difficulty relaxing  Patient confirms identification and approves PT to assess internal pelvic floor and treatment Yes  PELVIC MMT:   MMT eval  Vaginal 1/5 x 5 reps, 2 sec holding and difficulty relaxing  Internal Anal Sphincter   External Anal Sphincter   Puborectalis   Diastasis Recti 2 fingers  (Blank rows = not tested)        TONE: high  PROLAPSE: None observed  TODAY'S TREATMENT:  DATE:     05/04/23 Manual: Adductors, gluteals, lumbar paraspinals Trigger Point Dry-Needling  Treatment instructions: Expect mild to moderate muscle soreness. S/S of pneumothorax if dry needled over a lung field, and to seek immediate medical attention should they occur. Patient verbalized understanding of these instructions and education.  Patient Consent Given: Yes Education handout provided: Yes verbally explained Muscles treated: bil gluteal and piriformis, lumbar multifidi L5 bil Electrical stimulation performed: No Parameters: N/A Treatment response/outcome: increased soft tissue length palpated Patient confirms identification and approves physical therapist to perform internal soft tissue work  - left and right levators, obturators and coccygeus  05/23/23 MMT Rt shoulder UE flex and abduction with some "discomfort" and 4/5  Open book and  thoracic ext Manual Patient confirms identification and approves physical therapist to perform internal soft tissue work  - left and right levators, obturators and coccygeus Theract: Dilator usage  05/30/23 Manual: Adductors, gluteals, lumbar paraspinals Trigger Point Dry-Needling  Treatment instructions: Expect mild to moderate muscle soreness. S/S of pneumothorax if dry needled over a lung field, and to seek immediate medical attention should they occur. Patient verbalized understanding of these instructions and education.  Patient Consent Given: Yes Education handout provided: Yes verbally explained Muscles treated: bil adductors and  Electrical stimulation performed: No Parameters: N/A Treatment response/outcome: increased soft tissue length palpated Patient confirms identification and approves physical therapist to perform internal soft tissue work  - left and right levators, obturators and coccygeus  06/06/23 Manual: Adductors, gluteals, lumbar paraspinals Trigger Point Dry-Needling  Treatment instructions: Expect mild to moderate muscle soreness. S/S of pneumothorax if dry needled over a lung field, and to seek immediate medical attention should they occur. Patient verbalized understanding of these instructions and education.  Patient Consent Given: Yes Education handout provided: Yes verbally explained Muscles treated: bil adductors and  Electrical stimulation performed: No Parameters: N/A Treatment response/outcome: increased soft tissue length palpated Patient confirms identification and approves physical therapist to perform internal soft tissue work  - left and right levators, obturators and coccygeus  Quadruped - exhale and engage core Supine exhale engage core UE pressing on thigh - added to HEP but unable to print pic of exercise today - system not working PATIENT EDUCATION:  Education details: Access Code: [email or web address] Person educated: Patient Education method:  Programmer, multimedia, Facilities manager, Verbal cues, and Handouts Education comprehension: verbalized understanding  HOME EXERCISE PROGRAM: Access Code: [email or web address] URL: [email or web address] Date: 02/20/2023 Prepared by: Dwana Curd  Exercises - Seated Hamstring Stretch  - 1 x daily - 7 x weekly - 1 sets - 3 reps - 30 sec hold - Seated Piriformis Stretch with Trunk Bend  - 1 x daily - 7 x weekly - 1 sets - 3 reps - 30 sec hold - Supine Piriformis Stretch  - 1 x daily - 7 x weekly - 1 sets - 3 reps - 30 sec hold - Diaphragmatic Breathing in Child's Pose with Pelvic Floor Relaxation  - 1 x daily - 7 x weekly - 1 sets - 5 reps - 30 sec hold  ASSESSMENT:  CLINICAL IMPRESSION: Pt continues to have pain but feels about 50% better overall and feels like every time getting a little better.  Today's session focused again on pain management. Pt also is noted to have DRA of 2 fingers.  Pt is working on coordination of breathing and engaging the core for improved functional strength and not overusing the pelvic floor muscles . Pt also has noted tenderness with palpation of the bladder and  this duplicates her pain.  Pt is expected to continue to make progress and pt will benefit from skilled PT to address impairments and muscle imbalnces for improved function and pain management.  OBJECTIVE IMPAIRMENTS: decreased coordination, decreased endurance, decreased ROM, decreased strength, increased muscle spasms, impaired flexibility, impaired tone, postural dysfunction, and pain.   ACTIVITY LIMITATIONS:  pain with everything but is not limiting  PARTICIPATION LIMITATIONS: interpersonal relationship  PERSONAL FACTORS: 1 comorbidity: 3 vaginal deliveries  are also affecting patient's functional outcome.   REHAB POTENTIAL: Excellent  CLINICAL DECISION MAKING: Evolving/moderate complexity  EVALUATION COMPLEXITY: Moderate   GOALS: Goals reviewed with patient? Yes  SHORT TERM GOALS: Target  date: 03/20/23  Ind with initial HEP Baseline: Goal status: MET    LONG TERM GOALS: Target date: 07/18/23 Updated 05/23/23  Pt will be independent with advanced HEP to maintain improvements made throughout therapy  Baseline:  Goal status: IN PROGRESS  2.  Pt will report 75% reduction of pain due to improvements in posture, strength, and muscle length  Baseline:  Goal status: IN PROGRESS  3.  Pt will have 0/3 score of Marinoff scale  Baseline: 2/3  Goal status: IN PROGRESS    PLAN:  PT FREQUENCY: 1x/week  PT DURATION: 12 weeks  PLANNED INTERVENTIONS: Therapeutic exercises, Therapeutic activity, Neuromuscular re-education, Balance training, Gait training, Patient/Family education, Self Care, Joint mobilization, Aquatic Therapy, Dry Needling, Electrical stimulation, Cryotherapy, Moist heat, scar mobilization, Taping, Biofeedback, Manual therapy, and Re-evaluation  PLAN FOR NEXT SESSION: f/u on pain with intercourse, thoracic mobility and scap strength for improved posture, internal soft tissue bil if still having pain   Brayton Caves Coley Littles, PT 06/06/2023, 2:09 PM

## 2023-06-06 ENCOUNTER — Encounter: Payer: Self-pay | Admitting: Physical Therapy

## 2023-06-06 ENCOUNTER — Ambulatory Visit: Payer: Self-pay | Admitting: Physical Therapy

## 2023-06-06 DIAGNOSIS — M6281 Muscle weakness (generalized): Secondary | ICD-10-CM

## 2023-06-06 DIAGNOSIS — R293 Abnormal posture: Secondary | ICD-10-CM

## 2023-06-20 ENCOUNTER — Encounter: Payer: Self-pay | Admitting: Obstetrics & Gynecology

## 2023-06-20 ENCOUNTER — Ambulatory Visit (INDEPENDENT_AMBULATORY_CARE_PROVIDER_SITE_OTHER): Payer: Self-pay | Admitting: Obstetrics & Gynecology

## 2023-06-20 VITALS — BP 122/78 | HR 78

## 2023-06-20 DIAGNOSIS — N644 Mastodynia: Secondary | ICD-10-CM

## 2023-06-20 DIAGNOSIS — Z30011 Encounter for initial prescription of contraceptive pills: Secondary | ICD-10-CM

## 2023-06-20 DIAGNOSIS — N912 Amenorrhea, unspecified: Secondary | ICD-10-CM

## 2023-06-20 DIAGNOSIS — Z789 Other specified health status: Secondary | ICD-10-CM

## 2023-06-20 LAB — PREGNANCY, URINE: Preg Test, Ur: NEGATIVE

## 2023-06-20 MED ORDER — NORETHIN ACE-ETH ESTRAD-FE 1-20 MG-MCG PO TABS: 1.0000 | ORAL_TABLET | Freq: Every day | ORAL | 4 refills | Status: DC

## 2023-06-20 NOTE — Progress Notes (Signed)
    Michele Stephens May 29, 1991 [id number]        32 y.o.  [id number]   RP: Persistent intermittent Rt breast discomfort   HPI: Breastfeeding her 1 yo baby.  C/O occasional Rt breast discomfort, worse when the breast is congested with milk. Rt breast Mammo/US 02/28/23 was Negative. No breast pain currently, no redness, no fever.    OB History  Gravida Para Term Preterm AB Living  3 3 3    0 3  SAB IAB Ectopic Multiple Live Births  0   0 0 3    # Outcome Date GA Lbr Len/2nd Weight Sex Delivery Anes PTL Lv  3 Term 05/18/22 [redacted]w[redacted]d 01:07 / 00:10 8 lb 6 oz (3.8 kg) F Vag-Spont None  LIV  2 Term 09/01/19 [redacted]w[redacted]d 19:18 / 00:31 7 lb 10.2 oz (3.465 kg) F Vag-Spont None  LIV  1 Term 03/14/16 [redacted]w[redacted]d 28:14 / 02:20 7 lb 9.2 oz (3.435 kg) F Vag-Spont EPI, Local  LIV    Past medical history,surgical history, problem list, medications, allergies, family history and social history were all reviewed and documented in the EPIC chart.   Directed ROS with pertinent positives and negatives documented in the history of present illness/assessment and plan.  Exam:  Vitals:   06/20/23 1204  BP: 122/78  Pulse: 78  SpO2: 98%   General appearance:  Normal  Breast exam:  Normal bilateral breast exam.  No lumps, no erythema.  Normal nipples.  Axillae Neg bilaterally.   Assessment/Plan:  32 y.o. [id number]   1. Pain of right breast Continued intermittent Rt breast pain.  Normal breast exam.  Rt breast pain probably d/t breastfeeding.  Recommend to wean progressively and stop.  Rt Dx Mammo/US Neg in 02/2023.  Repeat Rt breast US now.  2. Breastfeeding (infant) Breastfeeding her 1 yo baby.  C/O occasional Rt breast discomfort, worse when the breast is congested with milk. Rt breast Mammo/US 02/28/23 was Negative. No breast pain currently, no redness, no fever.  Counseling done on how to wean and stop breastfeeding.   3. Amenorrhea UPT Neg.  Breastfeeding. - Pregnancy, urine  4. Encounter for initial  prescription of contraceptive pills UPT Neg.  Planning to stop Breastfeeding.  Start on Junel 1/20.  No CI.  Usage reviewed, prescription sent to pharmacy.  Other orders - norethindrone-ethinyl estradiol-FE (LOESTRIN FE) 1-20 MG-MCG tablet; Take 1 tablet by mouth daily.   Genia Del MD, 12:12 PM 06/20/2023

## 2023-06-21 ENCOUNTER — Telehealth: Payer: Self-pay

## 2023-06-21 DIAGNOSIS — N644 Mastodynia: Secondary | ICD-10-CM

## 2023-06-21 DIAGNOSIS — Z789 Other specified health status: Secondary | ICD-10-CM

## 2023-06-21 NOTE — Telephone Encounter (Signed)
-----   Message from Genia Del, MD sent at 06/20/2023 12:27 PM EDT ----- Regarding: Continued Rt breast pain Continued Rt breast pain.  Rt Dx Mammo/US Neg in 02/2023.  Repeat Rt breast US now.

## 2023-06-21 NOTE — Therapy (Unsigned)
OUTPATIENT PHYSICAL THERAPY FEMALE PELVIC TREATMENT   Patient Name: Michele Stephens MRN: [id number] DOB:07-19-1991, 32 y.o., female Today's Date: 06/22/2023  END OF SESSION:  Michele Stephens End of Session - 06/22/23 0858     Visit Number 8    Date for Michele Stephens Re-Evaluation 07/18/23    Authorization Type generic commercial    Michele Stephens Start Time 0853   arrived late   Michele Stephens Stop Time 0931    Michele Stephens Time Calculation (min) 38 min    Activity Tolerance Patient tolerated treatment well    Behavior During Therapy [facility] for tasks assessed/performed                   Past Medical History:  Diagnosis Date   Gestational diabetes    Past Surgical History:  Procedure Laterality Date   NO PAST SURGERIES     Patient Active Problem List   Diagnosis Date Noted   Pain of female symphysis pubis 02/13/2023   Pain of right breast 01/23/2023   Language barrier 05/18/2022   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  Overall it feels better with doing soft tissue work and is 50% improved. Fluid intake: Yes: 4 - 5 bottles of water    PAIN:  Are you having pain? Yes NPRS scale: 3/10 Pain location: Anterior and pelvis interior  Pain type: burning Pain description: intermittent   Aggravating factors: intercourse Relieving factors: green shakes, not having intercourse  PRECAUTIONS: None  WEIGHT BEARING RESTRICTIONS: No  FALLS:   Has patient fallen in last 6 months? No  LIVING ENVIRONMENT: Lives with: lives with their family and 3 children Lives in: House/apartment   OCCUPATION: not working  PLOF: Independent  PATIENT GOALS: intercourse without pain and not have pain in general  PERTINENT HISTORY:  3 vaginal deliveries Sexual abuse: No  BOWEL MOVEMENT: Pain with bowel movement: No Normal bowel movements  URINATION: Pain with urination: No Fully empty bladder: Yes:   Stream: Strong Urgency: No Frequency: normal, nocturia 2-3x Leakage:  no Pads: No  INTERCOURSE: Pain with intercourse: Initial Penetration, During Penetration, and After Intercourse lasts 2 days Ability to have vaginal penetration:  Yes: but not having due to pain Climax:  Marinoff Scale: 3/3  PREGNANCY: Vaginal deliveries 3 Tearing Yes: 1st one yes C-section deliveries  Currently pregnant : no  PROLAPSE: None   OBJECTIVE:   DIAGNOSTIC FINDINGS:    PATIENT SURVEYS:    PFIQ-7   COGNITION: Overall cognitive status: Within functional limits for tasks assessed     SENSATION: Light touch: Appears intact Proprioception: Appears intact  MUSCLE LENGTH: Hamstrings: Right 50 deg; Left 50 deg Thomas test:   LUMBAR SPECIAL TESTS:  ASLR - improved with core and lumbar support  FUNCTIONAL TESTS:  Single leg WFL   GAIT:  Comments: WFL   POSTURE:  Rt rotation lumbar and posterior ilium on the Rt in standing  PELVIC ALIGNMENT:  LUMBARAROM/PROM:  A/PROM A/PROM  eval  Flexion 75%  Extension   Right lateral flexion   Left lateral flexion   Right rotation   Left rotation    (Blank rows = not tested)  LOWER EXTREMITY ROM:  Passive ROM Right eval Left eval  Hip flexion    Hip extension    Hip abduction    Hip adduction    Hip internal rotation    Hip external rotation 50% 50%  Knee flexion    Knee extension    Ankle dorsiflexion    Ankle plantarflexion    Ankle inversion    Ankle eversion      (Blank rows = not tested)  LOWER EXTREMITY MMT:  MMT Right eval Left eval  Hip flexion    Hip extension    Hip abduction    Hip adduction    Hip internal rotation    Hip external rotation    Knee flexion    Knee extension    Ankle dorsiflexion    Ankle plantarflexion    Ankle inversion    Ankle eversion     PALPATION:   General  tight lumbar, gluteals, hamstrings                External Perineal Exam normal                             Internal Pelvic Floor tight and tender to palpation obturator and ileococcygeus, difficulty relaxing  Patient confirms identification and approves Michele Stephens to assess internal pelvic floor and treatment Yes  PELVIC MMT:   MMT eval  Vaginal 1/5 x 5 reps, 2 sec holding and difficulty relaxing  Internal Anal Sphincter   External Anal Sphincter   Puborectalis   Diastasis Recti 2 fingers  (Blank rows = not tested)        TONE: high  PROLAPSE: None observed  TODAY'S TREATMENT:  DATE:     05/23/23 MMT Rt shoulder UE flex and abduction with some "discomfort" and 4/5  Open book and thoracic ext Manual Patient confirms identification and approves physical therapist to perform internal soft tissue work  - left and right levators, obturators and coccygeus Theract: Dilator usage  05/30/23 Manual: Adductors, gluteals, lumbar paraspinals Trigger Point Dry-Needling  Treatment instructions: Expect mild to moderate muscle soreness. S/S of pneumothorax if dry needled over a lung field, and to seek immediate medical attention should they occur. Patient verbalized understanding of these instructions and education.  Patient Consent Given: Yes Education handout provided: Yes verbally explained Muscles treated: bil adductors and  Electrical stimulation performed: No Parameters: N/A Treatment response/outcome: increased soft  tissue length palpated Patient confirms identification and approves physical therapist to perform internal soft tissue work  - left and right levators, obturators and coccygeus  06/06/23 Manual: Adductors  Patient confirms identification and approves physical therapist to perform internal soft tissue work  - left and right levators, obturators and coccygeus  Exercises:  Plank on mat - UE reaches Resisted thoracic rotation bil  Resisted shoulder flexion with exhale Pallof press red band - 10x bil  Verbally reviewed from last time: Supine exhale engage core UE pressing on thigh - added to HEP but unable to print pic of exercise today - system not working   PATIENT EDUCATION:  Education details: Access Code: [email or web address] Person educated: Patient Education method: Programmer, multimedia, Facilities manager, Verbal cues, and Handouts Education comprehension: verbalized understanding  HOME EXERCISE PROGRAM: Access Code: [email or web address] URL: [email or web address] Date: 02/20/2023 Prepared by: Dwana Curd  Exercises - Seated Hamstring Stretch  - 1 x daily - 7 x weekly - 1 sets - 3 reps - 30 sec hold - Seated Piriformis Stretch with Trunk Bend  - 1 x daily - 7 x weekly - 1 sets - 3 reps - 30 sec hold - Supine Piriformis Stretch  - 1 x daily - 7 x weekly - 1 sets - 3 reps - 30 sec hold - Diaphragmatic Breathing in Child's Pose with Pelvic Floor Relaxation  - 1 x daily - 7 x weekly - 1 sets - 5 reps - 30 sec hold  ASSESSMENT:  CLINICAL IMPRESSION: Michele Stephens continues to report less pain with intercourse.  Still has some anterior pelvic pain and core instability.  Progression of core strength was added today and HEP updates to work on core strengthening.  Michele Stephens is expected to continue to make progress and Michele Stephens will benefit from skilled Michele Stephens to address impairments and muscle imbalnces for improved function and pain management.  OBJECTIVE IMPAIRMENTS: decreased coordination, decreased endurance, decreased  ROM, decreased strength, increased muscle spasms, impaired flexibility, impaired tone, postural dysfunction, and pain.   ACTIVITY LIMITATIONS:  pain with everything but is not limiting  PARTICIPATION LIMITATIONS: interpersonal relationship  PERSONAL FACTORS: 1 comorbidity: 3 vaginal deliveries  are also affecting patient's functional outcome.   REHAB POTENTIAL: Excellent  CLINICAL DECISION MAKING: Evolving/moderate complexity  EVALUATION COMPLEXITY: Moderate   GOALS: Goals reviewed with patient? Yes  SHORT TERM GOALS: Target date: 03/20/23  Ind with initial HEP Baseline: Goal status: MET    LONG TERM GOALS: Target date: 07/18/23 Updated 05/23/23  Michele Stephens will be independent with advanced HEP to maintain improvements made throughout therapy  Baseline:  Goal status: IN PROGRESS  2.  Michele Stephens will report 75% reduction of pain due to improvements in posture, strength, and muscle length  Baseline:  Goal status: IN PROGRESS  3.  Michele Stephens will have  0/3 score of Marinoff scale  Baseline: 2/3  Goal status: IN PROGRESS    PLAN:  Michele Stephens FREQUENCY: 1x/week  Michele Stephens DURATION: 12 weeks  PLANNED INTERVENTIONS: Therapeutic exercises, Therapeutic activity, Neuromuscular re-education, Balance training, Gait training, Patient/Family education, Self Care, Joint mobilization, Aquatic Therapy, Dry Needling, Electrical stimulation, Cryotherapy, Moist heat, scar mobilization, Taping, Biofeedback, Manual therapy, and Re-evaluation  PLAN FOR NEXT SESSION: f/u on pain with intercourse, continue core, posture and scap strength for improved posture and reduced muscle spasms in pelvic floor, internal soft tissue bil if still having pain   Brayton Caves Anikka Marsan, Michele Stephens 06/22/2023, 9:36 AM

## 2023-06-21 NOTE — Telephone Encounter (Signed)
Order placed. Will have interpreter call pt and inform her to contact [facility] to schedule appt.

## 2023-06-22 ENCOUNTER — Ambulatory Visit: Payer: Self-pay | Attending: Family Medicine | Admitting: Physical Therapy

## 2023-06-22 DIAGNOSIS — M6281 Muscle weakness (generalized): Secondary | ICD-10-CM | POA: Insufficient documentation

## 2023-06-22 DIAGNOSIS — R293 Abnormal posture: Secondary | ICD-10-CM | POA: Insufficient documentation

## 2023-06-22 NOTE — Telephone Encounter (Signed)
7/10/2024Jennye Moccasin, CMA Patient informed,

## 2023-06-27 ENCOUNTER — Other Ambulatory Visit: Payer: Self-pay | Admitting: Obstetrics & Gynecology

## 2023-06-27 DIAGNOSIS — N644 Mastodynia: Secondary | ICD-10-CM

## 2023-06-27 DIAGNOSIS — Z789 Other specified health status: Secondary | ICD-10-CM

## 2023-06-27 NOTE — Therapy (Deleted)
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  Rationale for Evaluation and Treatment: Rehabilitation  ONSET DATE: October 2023  SUBJECTIVE:                                                                                                                                                                                           SUBJECTIVE STATEMENT: Pt just has a little pain in the front and more after intercourse.  Overall it feels better with doing soft tissue work and is 50% improved. Fluid intake: Yes: 4 - 5 bottles of water    PAIN:  Are you having pain? Yes NPRS scale: 3/10 Pain location: Anterior and pelvis interior  Pain type: burning Pain description: intermittent   Aggravating factors: intercourse Relieving factors: green shakes, not having intercourse  PRECAUTIONS: None  WEIGHT BEARING RESTRICTIONS: No  FALLS:  Has patient fallen in last 6 months? No  LIVING ENVIRONMENT: Lives with: lives with their family and 3 children Lives in: House/apartment   OCCUPATION: not working  PLOF: Independent  PATIENT GOALS: intercourse without pain and not have pain in general  PERTINENT HISTORY:  3 vaginal deliveries Sexual abuse: No  BOWEL MOVEMENT: Pain with bowel movement: No Normal bowel  movements  URINATION: Pain with urination: No Fully empty bladder: Yes:   Stream: Strong Urgency: No Frequency: normal, nocturia 2-3x Leakage:  no Pads: No  INTERCOURSE: Pain with intercourse: Initial Penetration, During Penetration, and After Intercourse lasts 2 days Ability to have vaginal penetration:  Yes: but not having due to pain Climax:  Marinoff Scale: 3/3  PREGNANCY: Vaginal deliveries 3 Tearing Yes: 1st one yes C-section deliveries  Currently pregnant : no  PROLAPSE: None   OBJECTIVE:   DIAGNOSTIC FINDINGS:    PATIENT SURVEYS:    PFIQ-7   COGNITION: Overall cognitive status: Within functional limits for tasks assessed     SENSATION: Light touch:  Appears intact Proprioception: Appears intact  MUSCLE LENGTH: Hamstrings: Right 50 deg; Left 50 deg Thomas test:   LUMBAR SPECIAL TESTS:  ASLR - improved with core and lumbar support  FUNCTIONAL TESTS:  Single leg WFL   GAIT:  Comments: WFL   POSTURE:  Rt rotation lumbar and posterior ilium on the Rt in standing  PELVIC ALIGNMENT:  LUMBARAROM/PROM:  A/PROM A/PROM  eval  Flexion 75%  Extension   Right lateral flexion   Left lateral flexion   Right rotation   Left rotation    (Blank rows = not tested)  LOWER EXTREMITY ROM:  Passive ROM Right eval Left eval  Hip flexion    Hip extension    Hip abduction    Hip adduction    Hip internal rotation    Hip external rotation 50% 50%  Knee flexion    Knee extension    Ankle dorsiflexion    Ankle plantarflexion    Ankle inversion    Ankle eversion     (Blank rows = not tested)  LOWER EXTREMITY MMT:  MMT Right eval Left eval  Hip flexion    Hip extension    Hip abduction    Hip adduction    Hip internal rotation    Hip external rotation    Knee flexion    Knee extension    Ankle dorsiflexion    Ankle plantarflexion    Ankle inversion    Ankle eversion     PALPATION:   General  tight lumbar, gluteals, hamstrings                 External Perineal Exam normal                             Internal Pelvic Floor tight and tender to palpation obturator and ileococcygeus, difficulty relaxing  Patient confirms identification and approves PT to assess internal pelvic floor and treatment Yes  PELVIC MMT:   MMT eval  Vaginal 1/5 x 5 reps, 2 sec holding and difficulty relaxing  Internal Anal Sphincter   External Anal Sphincter   Puborectalis   Diastasis Recti 2 fingers  (Blank rows = not tested)        TONE: high  PROLAPSE: None observed  TODAY'S TREATMENT:                                                                                                                              DATE:     05/23/23 MMT Rt shoulder UE flex and abduction with some "discomfort" and 4/5  Open book and thoracic ext Manual Patient confirms identification and approves physical therapist to perform internal soft tissue work  - left and right levators, obturators and coccygeus Theract: Dilator usage  05/30/23 Manual: Adductors, gluteals, lumbar paraspinals Trigger Point Dry-Needling  Treatment instructions: Expect mild to moderate muscle soreness. S/S of pneumothorax if dry needled over a  lung field, and to seek immediate medical attention should they occur. Patient verbalized understanding of these instructions and education.  Patient Consent Given: Yes Education handout provided: Yes verbally explained Muscles treated: bil adductors and  Electrical stimulation performed: No Parameters: N/A Treatment response/outcome: increased soft tissue length palpated Patient confirms identification and approves physical therapist to perform internal soft tissue work  - left and right levators, obturators and coccygeus  06/06/23 Manual: Adductors  Patient confirms identification and approves physical therapist to perform internal soft tissue work  - left and right levators, obturators and coccygeus  Exercises:  Plank on mat  - UE reaches Resisted thoracic rotation bil  Resisted shoulder flexion with exhale Pallof press red band - 10x bil  Verbally reviewed from last time: Supine exhale engage core UE pressing on thigh - added to HEP but unable to print pic of exercise today - system not working   PATIENT EDUCATION:  Education details: Access Code: [email or web address] Person educated: Patient Education method: Programmer, multimedia, Facilities manager, Verbal cues, and Handouts Education comprehension: verbalized understanding  HOME EXERCISE PROGRAM: Access Code: [email or web address] URL: [email or web address] Date: 02/20/2023 Prepared by: Dwana Curd  Exercises - Seated Hamstring Stretch  - 1 x daily - 7 x weekly - 1 sets - 3 reps - 30 sec hold - Seated Piriformis Stretch with Trunk Bend  - 1 x daily - 7 x weekly - 1 sets - 3 reps - 30 sec hold - Supine Piriformis Stretch  - 1 x daily - 7 x weekly - 1 sets - 3 reps - 30 sec hold - Diaphragmatic Breathing in Child's Pose with Pelvic Floor Relaxation  - 1 x daily - 7 x weekly - 1 sets - 5 reps - 30 sec hold  ASSESSMENT:  CLINICAL IMPRESSION: Pt continues to report less pain with intercourse.  Still has some anterior pelvic pain and core instability.  Progression of core strength was added today and HEP updates to work on core strengthening.  Pt is expected to continue to make progress and pt will benefit from skilled PT to address impairments and muscle imbalnces for improved function and pain management.  OBJECTIVE IMPAIRMENTS: decreased coordination, decreased endurance, decreased ROM, decreased strength, increased muscle spasms, impaired flexibility, impaired tone, postural dysfunction, and pain.   ACTIVITY LIMITATIONS:  pain with everything but is not limiting  PARTICIPATION LIMITATIONS: interpersonal relationship  PERSONAL FACTORS: 1 comorbidity: 3 vaginal deliveries  are also affecting patient's functional outcome.   REHAB POTENTIAL: Excellent  CLINICAL  DECISION MAKING: Evolving/moderate complexity  EVALUATION COMPLEXITY: Moderate   GOALS: Goals reviewed with patient? Yes  SHORT TERM GOALS: Target date: 03/20/23  Ind with initial HEP Baseline: Goal status: MET    LONG TERM GOALS: Target date: 07/18/23 Updated 05/23/23  Pt will be independent with advanced HEP to maintain improvements made throughout therapy  Baseline:  Goal status: IN PROGRESS  2.  Pt will report 75% reduction of pain due to improvements in posture, strength, and muscle length  Baseline:  Goal status: IN PROGRESS  3.  Pt will have 0/3 score of Marinoff scale  Baseline: 2/3  Goal status: IN PROGRESS    PLAN:  PT FREQUENCY: 1x/week  PT DURATION: 12 weeks  PLANNED INTERVENTIONS: Therapeutic exercises, Therapeutic activity, Neuromuscular re-education, Balance training, Gait training, Patient/Family education, Self Care, Joint mobilization, Aquatic Therapy, Dry Needling, Electrical stimulation, Cryotherapy, Moist heat, scar mobilization, Taping, Biofeedback, Manual therapy, and Re-evaluation  PLAN FOR NEXT SESSION: f/u on pain with intercourse, continue core, posture and scap  strength for improved posture and reduced muscle spasms in pelvic floor, internal soft tissue bil if still having pain   Brayton Caves , PT 06/27/2023, 9:11 AM

## 2023-06-28 ENCOUNTER — Ambulatory Visit: Payer: Self-pay | Admitting: Physical Therapy

## 2023-06-28 ENCOUNTER — Telehealth: Payer: Self-pay | Admitting: Physical Therapy

## 2023-06-28 NOTE — Telephone Encounter (Signed)
Patient did not show for appointment.  Patient was called using the interpreter and no voicemail was enabled so unable to leave a message.  Russella Dar, PT, DPT 06/28/23 9:06 AM

## 2023-07-03 NOTE — Therapy (Unsigned)
OUTPATIENT PHYSICAL THERAPY FEMALE PELVIC TREATMENT   Patient Name: Michele Stephens MRN: [id number] DOB:12/10/1991, 32 y.o., female Today's Date: 07/04/2023  END OF SESSION:  PT End of Session - 07/04/23 1104     Visit Number 9    Date for PT Re-Evaluation 07/18/23    Authorization Type generic commercial    PT Start Time 1102    PT Stop Time 1142    PT Time Calculation (min) 40 min    Activity Tolerance Patient tolerated treatment well    Behavior During Therapy [facility] for tasks assessed/performed                    Past Medical History:  Diagnosis Date   Gestational diabetes    Past Surgical History:  Procedure Laterality Date   NO PAST SURGERIES     Patient Active Problem List   Diagnosis Date Noted   Pain of female symphysis pubis 02/13/2023   Pain of right breast 01/23/2023   Language barrier 05/18/2022   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    PCP: Quita Skye, PA-C   REFERRING PROVIDER: Latrelle Dodrill, MD   REFERRING DIAG: N94.9 (ICD-10-CM) - Pain of female symphysis pubis   THERAPY DIAG:  Abnormal posture  Muscle weakness (generalized)  Rationale for Evaluation and Treatment: Rehabilitation  ONSET DATE: October 2023  SUBJECTIVE:                                                                                                                                                                                           SUBJECTIVE STATEMENT: Pt states it is getting better little by little. Fluid intake: Yes: 4 - 5 bottles of water    PAIN:  Are you having pain? Yes NPRS scale: 3-4/10 during intercourse Pain location: Anterior and pelvis interior  Pain type: burning Pain description: intermittent   Aggravating factors: intercourse Relieving factors: green shakes, not having intercourse  PRECAUTIONS: None  WEIGHT BEARING RESTRICTIONS: No  FALLS:  Has patient fallen in last 6 months? No  LIVING ENVIRONMENT: Lives with:  lives with their family and 3 children Lives in: House/apartment   OCCUPATION: not working  PLOF: Independent  PATIENT GOALS: intercourse without pain and not have pain in general  PERTINENT HISTORY:  3 vaginal deliveries Sexual abuse: No  BOWEL MOVEMENT: Pain with bowel movement: No Normal bowel movements  URINATION: Pain with urination: No Fully empty bladder: Yes:   Stream: Strong Urgency: No Frequency: normal, nocturia 2-3x Leakage:  no Pads: No  INTERCOURSE: Pain with intercourse: Initial Penetration, During Penetration, and After Intercourse lasts 2 days Ability to have vaginal  penetration:  Yes: but not having due to pain Climax:  Marinoff Scale: 3/3  PREGNANCY: Vaginal deliveries 3 Tearing Yes: 1st one yes C-section deliveries  Currently pregnant : no  PROLAPSE: None   OBJECTIVE:   DIAGNOSTIC FINDINGS:    PATIENT SURVEYS:    PFIQ-7   COGNITION: Overall cognitive status: Within functional limits for tasks assessed     SENSATION: Light touch: Appears intact Proprioception: Appears intact  MUSCLE LENGTH: Hamstrings: Right 50 deg; Left 50 deg Thomas test:   LUMBAR SPECIAL TESTS:  ASLR - improved with core and lumbar support  FUNCTIONAL TESTS:  Single leg WFL   GAIT:  Comments: WFL   POSTURE:  Rt rotation lumbar and posterior ilium on the Rt in standing  PELVIC ALIGNMENT:  LUMBARAROM/PROM:  A/PROM A/PROM  eval  Flexion 75%  Extension   Right lateral flexion   Left lateral flexion   Right rotation   Left rotation    (Blank rows = not tested)  LOWER EXTREMITY ROM:  Passive ROM Right eval Left eval  Hip flexion    Hip extension    Hip abduction    Hip adduction    Hip internal rotation    Hip external rotation 50% 50%  Knee flexion    Knee extension    Ankle dorsiflexion    Ankle plantarflexion    Ankle inversion    Ankle eversion     (Blank rows = not tested)  LOWER EXTREMITY MMT:  MMT Right eval  Left eval  Hip flexion    Hip extension    Hip abduction    Hip adduction    Hip internal rotation    Hip external rotation    Knee flexion    Knee extension    Ankle dorsiflexion    Ankle plantarflexion    Ankle inversion    Ankle eversion     PALPATION:   General  tight lumbar, gluteals, hamstrings                External Perineal Exam normal                             Internal Pelvic Floor tight and tender to palpation obturator and ileococcygeus, difficulty relaxing  Patient confirms identification and approves PT to assess internal pelvic floor and treatment Yes  PELVIC MMT:   MMT eval  Vaginal 1/5 x 5 reps, 2 sec holding and difficulty relaxing  Internal Anal Sphincter   External Anal Sphincter   Puborectalis   Diastasis Recti 2 fingers  (Blank rows = not tested)        TONE: high  PROLAPSE: None observed  TODAY'S TREATMENT:                                                                                                                              DATE:     05/23/23 MMT Rt  shoulder UE flex and abduction with some "discomfort" and 4/5  Open book and thoracic ext Manual Patient confirms identification and approves physical therapist to perform internal soft tissue work  - left and right levators, obturators and coccygeus Theract: Dilator usage  05/30/23 Manual: Adductors, gluteals, lumbar paraspinals Trigger Point Dry-Needling  Treatment instructions: Expect mild to moderate muscle soreness. S/S of pneumothorax if dry needled over a lung field, and to seek immediate medical attention should they occur. Patient verbalized understanding of these instructions and education.  Patient Consent Given: Yes Education handout provided: Yes verbally explained Muscles treated: bil adductors and  Electrical stimulation performed: No Parameters: N/A Treatment response/outcome: increased soft tissue length palpated Patient confirms identification and approves  physical therapist to perform internal soft tissue work  - left and right levators, obturators and coccygeus  06/06/23 Manual: Adductors  Patient confirms identification and approves physical therapist to perform internal soft tissue work  - left and right levators, obturators and coccygeus  Exercises:  Plank on mat - UE reaches Resisted thoracic rotation bil  Resisted shoulder flexion with exhale Pallof press red band - 10x bil  Verbally reviewed from last time: Supine exhale engage core UE pressing on thigh - added to HEP but unable to print pic of exercise today - system not working 07/04/23 Manual:  Patient confirms identification and approves physical therapist to perform internal soft tissue work  - left and right levators, obturators and coccygeus - more tight Rt coccygeus but releases quickly Theract: Discussed pelvic wand and where to order  Exercises:  Quadruped - UE and LE with ball presses Quadruped rotation  Thoracic rotation with breathing sitting and supine  PATIENT EDUCATION:  Education details: Access Code: [email or web address] Person educated: Patient Education method: Programmer, multimedia, Facilities manager, Verbal cues, and Handouts Education comprehension: verbalized understanding  HOME EXERCISE PROGRAM: Access Code: [email or web address] URL: [email or web address] Date: 02/20/2023 Prepared by: Dwana Curd  Exercises - Seated Hamstring Stretch  - 1 x daily - 7 x weekly - 1 sets - 3 reps - 30 sec hold - Seated Piriformis Stretch with Trunk Bend  - 1 x daily - 7 x weekly - 1 sets - 3 reps - 30 sec hold - Supine Piriformis Stretch  - 1 x daily - 7 x weekly - 1 sets - 3 reps - 30 sec hold - Diaphragmatic Breathing in Child's Pose with Pelvic Floor Relaxation  - 1 x daily - 7 x weekly - 1 sets - 5 reps - 30 sec hold  ASSESSMENT:  CLINICAL IMPRESSION: Pt report little changes and improvement.  Pt was given more core strengthening exercises to work on today as well as  info about pelvic wand so she can learn to use at home.  Pt may need several more visits to ensure she can continue to make progress at home as she is making slower progress this past week.  Pt is expected to continue to make progress and pt will benefit from skilled PT to address impairments and muscle imbalnces for improved function and pain management.  OBJECTIVE IMPAIRMENTS: decreased coordination, decreased endurance, decreased ROM, decreased strength, increased muscle spasms, impaired flexibility, impaired tone, postural dysfunction, and pain.   ACTIVITY LIMITATIONS:  pain with everything but is not limiting  PARTICIPATION LIMITATIONS: interpersonal relationship  PERSONAL FACTORS: 1 comorbidity: 3 vaginal deliveries  are also affecting patient's functional outcome.   REHAB POTENTIAL: Excellent  CLINICAL DECISION MAKING: Evolving/moderate complexity  EVALUATION COMPLEXITY: Moderate   GOALS: Goals reviewed with patient? Yes  SHORT  TERM GOALS: Target date: 03/20/23  Ind with initial HEP Baseline: Goal status: MET    LONG TERM GOALS: Target date: 07/18/23 Updated 05/23/23  Pt will be independent with advanced HEP to maintain improvements made throughout therapy  Baseline:  Goal status: IN PROGRESS  2.  Pt will report 75% reduction of pain due to improvements in posture, strength, and muscle length  Baseline:  Goal status: IN PROGRESS  3.  Pt will have 0/3 score of Marinoff scale  Baseline: 2/3  Goal status: IN PROGRESS    PLAN:  PT FREQUENCY: 1x/week  PT DURATION: 12 weeks  PLANNED INTERVENTIONS: Therapeutic exercises, Therapeutic activity, Neuromuscular re-education, Balance training, Gait training, Patient/Family education, Self Care, Joint mobilization, Aquatic Therapy, Dry Needling, Electrical stimulation, Cryotherapy, Moist heat, scar mobilization, Taping, Biofeedback, Manual therapy, and Re-evaluation  PLAN FOR NEXT SESSION: f/u on pain with intercourse,  continue core progress, thoracic mob and oblique strength, posture and scap strength for improved posture and reduced muscle spasms in pelvic floor, internal soft tissue bil if still having pain   Jakki L Niasha Devins, PT 07/04/2023, 11:05 AM

## 2023-07-04 ENCOUNTER — Ambulatory Visit: Payer: Self-pay | Admitting: Physical Therapy

## 2023-07-04 DIAGNOSIS — M6281 Muscle weakness (generalized): Secondary | ICD-10-CM

## 2023-07-04 DIAGNOSIS — R293 Abnormal posture: Secondary | ICD-10-CM

## 2023-07-05 NOTE — Telephone Encounter (Signed)
Per appt notes: "06/27/2023 1ST ATTEMPT/PT WANTS U/S/AND WILL CONTACT OTHER LOCATION FOR U/S ONLY-[facility]"

## 2023-07-10 NOTE — Therapy (Unsigned)
OUTPATIENT PHYSICAL THERAPY FEMALE PELVIC TREATMENT   Patient Name: Michele Stephens MRN: [id number] DOB:28-Sep-1991, 32 y.o., female Today's Date: 07/11/2023  END OF SESSION:  PT End of Session - 07/11/23 1145     Visit Number 10    Date for PT Re-Evaluation 07/18/23    Authorization Type generic commercial    PT Start Time 1058    PT Stop Time 1143    PT Time Calculation (min) 45 min    Activity Tolerance Patient tolerated treatment well    Behavior During Therapy [facility] for tasks assessed/performed                     Past Medical History:  Diagnosis Date   Gestational diabetes    Past Surgical History:  Procedure Laterality Date   NO PAST SURGERIES     Patient Active Problem List   Diagnosis Date Noted   Pain of female symphysis pubis 02/13/2023   Pain of right breast 01/23/2023   Language barrier 05/18/2022   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    PCP: Quita Skye, PA-C   REFERRING PROVIDER: Latrelle Dodrill, MD   REFERRING DIAG: N94.9 (ICD-10-CM) - Pain of female symphysis pubis   THERAPY DIAG:  Abnormal posture  Muscle weakness (generalized)  Rationale for Evaluation and Treatment: Rehabilitation  ONSET DATE: October 2023  SUBJECTIVE:                                                                                                                                                                                           SUBJECTIVE STATEMENT: Pt states it is still the same Fluid intake: Yes: 4 - 5 bottles of water    PAIN:  Are you having pain? Yes NPRS scale: 3-4/10 during intercourse Pain location: Anterior and pelvis interior  Pain type: burning Pain description: intermittent   Aggravating factors: intercourse Relieving factors: green shakes, not having intercourse  PRECAUTIONS: None  WEIGHT BEARING RESTRICTIONS: No  FALLS:  Has patient fallen in last 6 months? No  LIVING ENVIRONMENT: Lives with: lives with their  family and 3 children Lives in: House/apartment   OCCUPATION: not working  PLOF: Independent  PATIENT GOALS: intercourse without pain and not have pain in general  PERTINENT HISTORY:  3 vaginal deliveries Sexual abuse: No  BOWEL MOVEMENT: Pain with bowel movement: No Normal bowel movements  URINATION: Pain with urination: No Fully empty bladder: Yes:   Stream: Strong Urgency: No Frequency: normal, nocturia 2-3x Leakage:  no Pads: No  INTERCOURSE: Pain with intercourse: Initial Penetration, During Penetration, and After Intercourse lasts 2 days Ability to have vaginal penetration:  Yes: but not having due to pain Climax:  Marinoff Scale: 3/3  PREGNANCY: Vaginal deliveries 3 Tearing Yes: 1st one yes C-section deliveries  Currently pregnant : no  PROLAPSE: None   OBJECTIVE:   DIAGNOSTIC FINDINGS:    PATIENT SURVEYS:    PFIQ-7   COGNITION: Overall cognitive status: Within functional limits for tasks assessed     SENSATION: Light touch: Appears intact Proprioception: Appears intact  MUSCLE LENGTH: Hamstrings: Right 50 deg; Left 50 deg Thomas test:   LUMBAR SPECIAL TESTS:  ASLR - improved with core and lumbar support  FUNCTIONAL TESTS:  Single leg WFL   GAIT:  Comments: WFL   POSTURE:  Rt rotation lumbar and posterior ilium on the Rt in standing  PELVIC ALIGNMENT:  LUMBARAROM/PROM:  A/PROM A/PROM  eval  Flexion 75%  Extension   Right lateral flexion   Left lateral flexion   Right rotation   Left rotation    (Blank rows = not tested)  LOWER EXTREMITY ROM:  Passive ROM Right eval Left eval  Hip flexion    Hip extension    Hip abduction    Hip adduction    Hip internal rotation    Hip external rotation 50% 50%  Knee flexion    Knee extension    Ankle dorsiflexion    Ankle plantarflexion    Ankle inversion    Ankle eversion     (Blank rows = not tested)  LOWER EXTREMITY MMT:  MMT Right eval Left eval  Hip flexion     Hip extension    Hip abduction    Hip adduction    Hip internal rotation    Hip external rotation    Knee flexion    Knee extension    Ankle dorsiflexion    Ankle plantarflexion    Ankle inversion    Ankle eversion     PALPATION:   General  tight lumbar, gluteals, hamstrings                External Perineal Exam normal                             Internal Pelvic Floor tight and tender to palpation obturator and ileococcygeus, difficulty relaxing  Patient confirms identification and approves PT to assess internal pelvic floor and treatment Yes  PELVIC MMT:   MMT eval  Vaginal 1/5 x 5 reps, 2 sec holding and difficulty relaxing  Internal Anal Sphincter   External Anal Sphincter   Puborectalis   Diastasis Recti 2 fingers  (Blank rows = not tested)        TONE: high  PROLAPSE: None observed  TODAY'S TREATMENT:                                                                                                                              DATE:    06/06/23 Manual: Adductors  Patient confirms  identification and approves physical therapist to perform internal soft tissue work  - left and right levators, obturators and coccygeus  Exercises:  Plank on mat - UE reaches Resisted thoracic rotation bil  Resisted shoulder flexion with exhale Pallof press red band - 10x bil  Verbally reviewed from last time: Supine exhale engage core UE pressing on thigh - added to HEP but unable to print pic of exercise today - system not working 07/04/23 Manual:  Patient confirms identification and approves physical therapist to perform internal soft tissue work  - left and right levators, obturators and coccygeus - more tight Rt coccygeus but releases quickly Theract: Discussed pelvic wand and where to order  Exercises:  Quadruped - UE and LE with ball presses Quadruped rotation  Thoracic rotation with breathing sitting and supine  07/11/23 Manual:  Patient confirms  identification and approves physical therapist to perform internal soft tissue work  - left and right levators, obturators and coccygeus    Exercises:  Quadruped - UE and LE reaches Quadruped rotation  Supine presses into pball Supine march with yellow loop  PATIENT EDUCATION:  Education details: Access Code: [email or web address] Person educated: Patient Education method: Programmer, multimedia, Facilities manager, Verbal cues, and Handouts Education comprehension: verbalized understanding  HOME EXERCISE PROGRAM: Access Code: [email or web address] URL: [email or web address] Date: 02/20/2023 Prepared by: Dwana Curd  Exercises - Seated Hamstring Stretch  - 1 x daily - 7 x weekly - 1 sets - 3 reps - 30 sec hold - Seated Piriformis Stretch with Trunk Bend  - 1 x daily - 7 x weekly - 1 sets - 3 reps - 30 sec hold - Supine Piriformis Stretch  - 1 x daily - 7 x weekly - 1 sets - 3 reps - 30 sec hold - Diaphragmatic Breathing in Child's Pose with Pelvic Floor Relaxation  - 1 x daily - 7 x weekly - 1 sets - 5 reps - 30 sec hold  ASSESSMENT:  CLINICAL IMPRESSION: Pt is doing well with exercises and core strength at home.  Pt has stayed consistent with only a little pain now with intercourse. Pt will d/c today with HEP  OBJECTIVE IMPAIRMENTS: decreased coordination, decreased endurance, decreased ROM, decreased strength, increased muscle spasms, impaired flexibility, impaired tone, postural dysfunction, and pain.   ACTIVITY LIMITATIONS:  pain with everything but is not limiting  PARTICIPATION LIMITATIONS: interpersonal relationship  PERSONAL FACTORS: 1 comorbidity: 3 vaginal deliveries  are also affecting patient's functional outcome.   REHAB POTENTIAL: Excellent  CLINICAL DECISION MAKING: Evolving/moderate complexity  EVALUATION COMPLEXITY: Moderate   GOALS: Goals reviewed with patient? Yes  SHORT TERM GOALS: Target date: 03/20/23  Ind with initial HEP Baseline: Goal status: MET    LONG  TERM GOALS: Target date: 07/18/23 Updated 07/11/23  Pt will be independent with advanced HEP to maintain improvements made throughout therapy  Baseline:  Goal status: MET  2.  Pt will report 75% reduction of pain due to improvements in posture, strength, and muscle length  Baseline: 50-75% Goal status: mostly MET  3.  Pt will have 0/3 score of Marinoff scale  Baseline: 1/3  Goal status: partly met    PLAN:  PT FREQUENCY: 1x/week  PT DURATION: 12 weeks  PLANNED INTERVENTIONS: Therapeutic exercises, Therapeutic activity, Neuromuscular re-education, Balance training, Gait training, Patient/Family education, Self Care, Joint mobilization, Aquatic Therapy, Dry Needling, Electrical stimulation, Cryotherapy, Moist heat, scar mobilization, Taping, Biofeedback, Manual therapy, and Re-evaluation  PLAN FOR NEXT SESSION: d/c today   Junious Silk, PT 07/11/2023, 11:51 AM  PHYSICAL THERAPY DISCHARGE SUMMARY  Visits from Start of Care: 10  Current functional level related to goals / functional outcomes: See above goals   Remaining deficits: See above   Education / Equipment: HEP   Patient agrees to discharge. Patient goals were partially met. Patient is being discharged due to being pleased with the current functional level.  Russella Dar, PT, DPT 07/11/23 11:51 AM

## 2023-07-11 ENCOUNTER — Ambulatory Visit: Payer: Self-pay | Admitting: Physical Therapy

## 2023-07-11 DIAGNOSIS — R293 Abnormal posture: Secondary | ICD-10-CM

## 2023-07-11 DIAGNOSIS — M6281 Muscle weakness (generalized): Secondary | ICD-10-CM

## 2023-07-31 NOTE — Telephone Encounter (Signed)
Per CS: "Patient wants breast US only does not want another mammogram."  An email has been sent to pt access supervisor at [facility] to inquire if pt could receive [place] only if desired.

## 2023-07-31 NOTE — Telephone Encounter (Signed)
At your earliest convenience, can we try contacting the pt to see what her plans are as far as her breast ultrasound recommendation/order?   Looks like she was contacted by someone at the breast center but they noted that she was going to contact another center to have an [place] only performed.?  TIA.

## 2023-08-04 NOTE — Telephone Encounter (Signed)
Per [facility]: They require the pt's to have a mammo w/ [place] per radiologists protocol unless recommended by them otherwise.

## 2023-08-04 NOTE — Telephone Encounter (Signed)
Typically over age 31 all facilities require mammo first

## 2023-08-07 NOTE — Telephone Encounter (Signed)
After calling pt today using interpreter, I noticed in pt's EMR that it mentions that she was breastfeeding. So, I had our in-office spanish translator CS call the pt to inquire if still breastfeeding.   CS reports pt was breastfeeding. However, stopped last week. Pt reports just not wanting to have another mammo again so soon after the last.   Spoke w/ [facility] to see if mammo would still be recommended since pt was recently breastfeeding.   Spoke w/ Destiny @ [facility] and she reported that it would still be recommended for the pt to have the mammo also. It would just be advised for the pt to stop breastfeeding an hour prior to her appt time.   Please advise what you would recommend advising the pt? Thanks.

## 2023-08-07 NOTE — Telephone Encounter (Signed)
Used Interpreter Line  ID Adrianna [phone]  No answer, and unable to LVM, will try again later.

## 2023-08-08 NOTE — Telephone Encounter (Signed)
Yes, thank you.

## 2023-08-08 NOTE — Telephone Encounter (Signed)
Used Interpreter Services   ID [employer] [phone]  Pt notified and voiced understanding. However, while on the phone she inquired if mammogram included images close to her shoulder? States that she feels most of the pain/burning sensations that she is experiencing seem to not necessarily be coming from the breast tissue but more into her shoulder.   Pt inquiring if mammogram could not capture shoulder, if other imaging could be ordered? Please advise.

## 2023-08-08 NOTE — Telephone Encounter (Signed)
Would you mind contacting the pt back and informing her of the provider's recommendation for her shoulder concern/pain and for her to see her PCP regarding those concerns?   Let us know if she has anything additional.  Thanks.

## 2023-08-08 NOTE — Telephone Encounter (Signed)
Per CS: Pt informed to f/u w/ PCP for shoulder pain/burning.   OK to delete orders for mammo and close encounter?

## 2023-08-08 NOTE — Telephone Encounter (Signed)
Agree she needs mammo

## 2023-08-08 NOTE — Telephone Encounter (Signed)
Needs to see PCP for that

## 2023-08-22 ENCOUNTER — Emergency Department (HOSPITAL_COMMUNITY)
Admission: EM | Admit: 2023-08-22 | Discharge: 2023-08-22 | Disposition: A | Discharge status: Home / Self Care | Payer: Self-pay | Attending: Emergency Medicine | Admitting: Emergency Medicine

## 2023-08-22 ENCOUNTER — Other Ambulatory Visit: Payer: Self-pay

## 2023-08-22 ENCOUNTER — Emergency Department (HOSPITAL_COMMUNITY): Payer: Self-pay

## 2023-08-22 DIAGNOSIS — R079 Chest pain, unspecified: Secondary | ICD-10-CM | POA: Insufficient documentation

## 2023-08-22 MED ORDER — FAMOTIDINE 20 MG PO TABS
20.0000 mg | ORAL_TABLET | Freq: Once | ORAL | Status: AC
  Administered 2023-08-22: 20 mg via ORAL
  Filled 2023-08-22: qty 1

## 2023-08-22 MED ORDER — FAMOTIDINE 20 MG PO TABS: 20.0000 mg | ORAL_TABLET | Freq: Two times a day (BID) | ORAL | 0 refills | Status: DC

## 2023-08-22 NOTE — ED Provider Triage Note (Signed)
Emergency Medicine Provider Triage Evaluation Note  Gregoria Vito , a 32 y.o. female  was evaluated in triage.  Pt complains of right breast burning since November 2023. Has been seen for same by multiple providers with normal breast ultrasound and mammogram. Slight increase in burning but otherwise no change. Was lactating until 2 months ago. No fever, nausea, vomiting, or body aches.  Review of Systems  Positive: See HPI Negative: See HPI  Physical Exam  BP 114/70   Pulse 73   Temp 98.6 F (37 C) (Oral)   Resp 16   SpO2 100%  Gen:   Awake, no distress   Resp:  Normal effort  MSK:   Moves extremities without difficulty  Other:  Nontoxic appearing  Medical Decision Making  Medically screening exam initiated at 3:28 PM.  Appropriate orders placed.  Harrie Jeans was informed that the remainder of the evaluation will be completed by another provider, this initial triage assessment does not replace that evaluation, and the importance of remaining in the ED until their evaluation is complete.  Spanish interpretor used id [id number]   Tonette Lederer, PA-C 08/22/23 1530

## 2023-08-22 NOTE — ED Triage Notes (Signed)
Patient arrives for eval of R breast burning. Pain present since November 2023 but states the pain is getting worse. Patient has seen several physicians for this and had workups including mammograms but all have been unremarkable.

## 2023-08-22 NOTE — ED Provider Notes (Deleted)
[place] EMERGENCY DEPARTMENT AT [facility] Provider Note   CSN: [id number] Arrival date & time: 08/22/23  1445     History Chief Complaint  Patient presents with   Breast Pain    HPI Nareh Younghans is a 32 y.o. female presenting for a chief complaint of pain in the right neck. Described as a burning sensation patient cannot provide any further description of the symptoms even using a Spanish interpreter she says it just feels like an occasional burning in the right side of her anterior chest without any rash or other changes denying fevers chills nausea vomiting syncope shortness of breath. Per triage note she she endorsed some concern for "breast swelling and bursting but has declined any breast concerns to me. Patient's recorded medical, surgical, social, medication list and allergies were reviewed in the Snapshot window as part of the initial history.   Review of Systems   Review of Systems  Constitutional:  Negative for chills and fever.  HENT:  Negative for ear pain and sore throat.   Eyes:  Negative for pain and visual disturbance.  Respiratory:  Negative for cough and shortness of breath.   Cardiovascular:  Negative for chest pain and palpitations.  Gastrointestinal:  Negative for abdominal pain and vomiting.  Genitourinary:  Negative for dysuria and hematuria.  Musculoskeletal:  Negative for arthralgias and back pain.  Skin:  Negative for color change.  Neurological:  Negative for seizures and syncope.  All other systems reviewed and are negative.   Physical Exam Updated Vital Signs BP 110/66 (BP Location: Right Arm)   Pulse 60   Temp 98.2 F (36.8 C) (Oral)   Resp 20   SpO2 100%  Physical Exam Vitals and nursing note reviewed.  Constitutional:      General: She is not in acute distress.    Appearance: She is well-developed.  HENT:     Head: Normocephalic and atraumatic.  Eyes:     Conjunctiva/sclera: Conjunctivae normal.  Cardiovascular:      Rate and Rhythm: Normal rate and regular rhythm.     Heart sounds: No murmur heard. Pulmonary:     Effort: Pulmonary effort is normal. No respiratory distress.     Breath sounds: Normal breath sounds.  Abdominal:     General: There is no distension.     Palpations: Abdomen is soft.     Tenderness: There is no abdominal tenderness. There is no right CVA tenderness or left CVA tenderness.  Musculoskeletal:        General: No swelling or tenderness. Normal range of motion.     Cervical back: Neck supple.  Skin:    General: Skin is warm and dry.  Neurological:     General: No focal deficit present.     Mental Status: She is alert and oriented to person, place, and time. Mental status is at baseline.     Cranial Nerves: No cranial nerve deficit.      ED Course/ Medical Decision Making/ A&P    Procedures Procedures   Medications Ordered in ED Medications  famotidine (PEPCID) tablet 20 mg (has no administration in time range)    Medical Decision Making:    Macrina Isenbarger is a 32 y.o. female who presented to the ED today with pain in the right shoulder detailed above.     Patient placed on continuous vitals and telemetry monitoring while in ED which was reviewed periodically.   Complete initial physical exam performed, notably the patient  was HDS in NAD.      Reviewed and confirmed nursing documentation for past medical history, family history, social history.    Initial Assessment:   Patient with nonspecific burning sensation in the chest.  Denies any SOB/Fevers or concerning symptoms. Well appearing with normal vitals.  Will provide chest x-ray to evaluate for underlying pneumothorax as a cause of her atypical symptoms as well as EKG to evaluate for ACS though these are considered relatively unlikely given her presentation.  Will refer her back to her PCP for ongoing care in the outpatient setting if chest x-ray and EKG are nondiagnostic for any acute pathology and  trial antacid in interim. Notably patient is PERC positive secondary to estrogen use but without active chest pain without tachycardia without signs of DVT and otherwise low risk for pulmonary embolism, asymptomatic at this time and without evidence of any further pathology after a 7-1/2-hour wait in the emergency department. Patient declined x-ray.  No acute indication for further intervention in the emergency room.  Disposition:  I have considered need for hospitalization, however, considering all of the above, I believe this patient is stable for discharge at this time.  Patient/family educated about specific return precautions for given chief complaint and symptoms.  Patient/family educated about follow-up with PCP.     Patient/family expressed understanding of return precautions and need for follow-up. Patient spoken to regarding all imaging and laboratory results and appropriate follow up for these results. All education provided in verbal form with additional information in written form. Time was allowed for answering of patient questions. Patient discharged.    Emergency Department Medication Summary:   Medications  famotidine (PEPCID) tablet 20 mg (has no administration in time range)         Clinical Impression:  1. Chest pain, unspecified type      Data Unavailable   Final Clinical Impression(s) / ED Diagnoses Final diagnoses:  Chest pain, unspecified type    Rx / DC Orders ED Discharge Orders          Ordered    famotidine (PEPCID) 20 MG tablet  2 times daily        08/22/23 2218    famotidine (PEPCID) 20 MG tablet  2 times daily        Pending              Glyn Ade, MD 08/22/23 2334

## 2023-08-29 NOTE — ED Provider Notes (Signed)
EMERGENCY DEPARTMENT AT [facility] Provider Note   CSN: [id number] Arrival date & time: 08/22/23  1445     History Chief Complaint  Patient presents with   Breast Pain    HPI Michele Stephens is a 32 y.o. female presenting for a chief complaint of pain in the right neck. Described as a burning sensation patient cannot provide any further description of the symptoms even using a Spanish interpreter she says it just feels like an occasional burning in the right side of her anterior chest without any rash or other changes denying fevers chills nausea vomiting syncope shortness of breath. Per triage note she she endorsed some concern for "breast swelling and bursting but has declined any breast concerns to me. Patient's recorded medical, surgical, social, medication list and allergies were reviewed in the Snapshot window as part of the initial history.   Review of Systems   Review of Systems  Constitutional:  Negative for chills and fever.  HENT:  Negative for ear pain and sore throat.   Eyes:  Negative for pain and visual disturbance.  Respiratory:  Negative for cough and shortness of breath.   Cardiovascular:  Negative for chest pain and palpitations.  Gastrointestinal:  Negative for abdominal pain and vomiting.  Genitourinary:  Negative for dysuria and hematuria.  Musculoskeletal:  Negative for arthralgias and back pain.  Skin:  Negative for color change.  Neurological:  Negative for seizures and syncope.  All other systems reviewed and are negative.   Physical Exam Updated Vital Signs BP 112/70   Pulse 61   Temp 98.2 F (36.8 C) (Oral)   Resp 15   SpO2 100%  Physical Exam Vitals and nursing note reviewed.  Constitutional:      General: She is not in acute distress.    Appearance: She is well-developed.  HENT:     Head: Normocephalic and atraumatic.  Eyes:     Conjunctiva/sclera: Conjunctivae normal.  Cardiovascular:     Rate and Rhythm:  Normal rate and regular rhythm.     Heart sounds: No murmur heard. Pulmonary:     Effort: Pulmonary effort is normal. No respiratory distress.     Breath sounds: Normal breath sounds.  Abdominal:     General: There is no distension.     Palpations: Abdomen is soft.     Tenderness: There is no abdominal tenderness. There is no right CVA tenderness or left CVA tenderness.  Musculoskeletal:        General: No swelling or tenderness. Normal range of motion.     Cervical back: Neck supple.  Skin:    General: Skin is warm and dry.  Neurological:     General: No focal deficit present.     Mental Status: She is alert and oriented to person, place, and time. Mental status is at baseline.     Cranial Nerves: No cranial nerve deficit.      ED Course/ Medical Decision Making/ A&P    Procedures Procedures   Medications Ordered in ED Medications  famotidine (PEPCID) tablet 20 mg (20 mg Oral Given 08/22/23 2228)    Medical Decision Making:    Michele Stephens is a 32 y.o. female who presented to the ED today with pain in the right shoulder detailed above.     Patient placed on continuous vitals and telemetry monitoring while in ED which was reviewed periodically.   Complete initial physical exam performed, notably the patient  was HDS in NAD.  Reviewed and confirmed nursing documentation for past medical history, family history, social history.    Initial Assessment:   Patient with nonspecific burning sensation in the chest.  Denies any SOB/Fevers or concerning symptoms. Well appearing with normal vitals.  Will provide chest x-ray to evaluate for underlying pneumothorax as a cause of her atypical symptoms as well as EKG to evaluate for ACS though these are considered relatively unlikely given her presentation.  Will refer her back to her PCP for ongoing care in the outpatient setting if chest x-ray and EKG are nondiagnostic for any acute pathology and trial antacid in  interim. Notably patient is PERC positive secondary to estrogen use but without active chest pain without tachycardia without signs of DVT and otherwise low risk for pulmonary embolism, asymptomatic at this time and without evidence of any further pathology after a 7-1/2-hour wait in the emergency department. Patient declined x-ray.  No acute indication for further intervention in the emergency room.  Disposition:  I have considered need for hospitalization, however, considering all of the above, I believe this patient is stable for discharge at this time.  Patient/family educated about specific return precautions for given chief complaint and symptoms.  Patient/family educated about follow-up with PCP.     Patient/family expressed understanding of return precautions and need for follow-up. Patient spoken to regarding all imaging and laboratory results and appropriate follow up for these results. All education provided in verbal form with additional information in written form. Time was allowed for answering of patient questions. Patient discharged.    Emergency Department Medication Summary:   Medications  famotidine (PEPCID) tablet 20 mg (20 mg Oral Given 08/22/23 2228)         Clinical Impression:  1. Chest pain, unspecified type      Discharge   Final Clinical Impression(s) / ED Diagnoses Final diagnoses:  Chest pain, unspecified type    Rx / DC Orders ED Discharge Orders          Ordered    famotidine (PEPCID) 20 MG tablet  2 times daily        08/22/23 2245    famotidine (PEPCID) 20 MG tablet  2 times daily        08/22/23 2218              Glyn Ade, MD 08/22/23 2334  Note Resubmitted as initial required cosign.    Glyn Ade, MD 08/29/23 1459

## 2023-11-03 ENCOUNTER — Other Ambulatory Visit: Payer: Self-pay | Admitting: Physician Assistant

## 2023-11-03 DIAGNOSIS — M542 Cervicalgia: Secondary | ICD-10-CM

## 2023-11-06 ENCOUNTER — Inpatient Hospital Stay
Admission: RE | Admit: 2023-11-06 | Discharge: 2023-11-06 | Discharge status: Home / Self Care | Payer: Self-pay | Source: Ambulatory Visit | Attending: Physician Assistant | Admitting: Physician Assistant

## 2023-11-06 DIAGNOSIS — M542 Cervicalgia: Secondary | ICD-10-CM

## 2023-12-18 ENCOUNTER — Encounter (HOSPITAL_COMMUNITY): Payer: Self-pay

## 2023-12-18 ENCOUNTER — Ambulatory Visit (HOSPITAL_COMMUNITY)
Admission: EM | Admit: 2023-12-18 | Discharge: 2023-12-18 | Disposition: A | Discharge status: Home / Self Care | Payer: Self-pay | Attending: Family Medicine | Admitting: Family Medicine

## 2023-12-18 DIAGNOSIS — R1032 Left lower quadrant pain: Secondary | ICD-10-CM | POA: Insufficient documentation

## 2023-12-18 DIAGNOSIS — N200 Calculus of kidney: Secondary | ICD-10-CM | POA: Insufficient documentation

## 2023-12-18 LAB — POCT URINALYSIS DIP (MANUAL ENTRY)
Bilirubin, UA: NEGATIVE
Glucose, UA: NEGATIVE mg/dL
Ketones, POC UA: NEGATIVE mg/dL
Nitrite, UA: NEGATIVE
Protein Ur, POC: NEGATIVE mg/dL
Spec Grav, UA: 1.01 (ref 1.010–1.025)
Urobilinogen, UA: 0.2 U/dL
pH, UA: 6.5 (ref 5.0–8.0)

## 2023-12-18 LAB — POCT URINE PREGNANCY: Preg Test, Ur: NEGATIVE

## 2023-12-18 MED ORDER — KETOROLAC TROMETHAMINE 30 MG/ML IJ SOLN
30.0000 mg | Freq: Once | INTRAMUSCULAR | Status: AC
  Administered 2023-12-18: 30 mg via INTRAMUSCULAR

## 2023-12-18 MED ORDER — KETOROLAC TROMETHAMINE 30 MG/ML IJ SOLN
INTRAMUSCULAR | Status: AC
  Filled 2023-12-18: qty 1

## 2023-12-18 MED ORDER — TAMSULOSIN HCL 0.4 MG PO CAPS: 0.4000 mg | ORAL_CAPSULE | Freq: Every day | ORAL | 0 refills | Status: DC

## 2023-12-18 MED ORDER — KETOROLAC TROMETHAMINE 10 MG PO TABS: 10.0000 mg | ORAL_TABLET | Freq: Four times a day (QID) | ORAL | 0 refills | Status: DC | PRN

## 2023-12-18 NOTE — ED Triage Notes (Signed)
 Per interpreter, Pt c/o LLQ pain that woke her up at 2am. States had nausea 2 months ago. [facility] 12/22. States took naproxen with no relief.

## 2023-12-18 NOTE — Discharge Instructions (Addendum)
 The pregnancy test was negative. (La prueba de embarazo fue negativa.)  The urinalysis showed a lot of red blood cells and a little bit of white blood cells.  This is most likely consistent with a stone in your urinary system. (El anlisis de orina mostr muchos glbulos rojos y un poco de glbulos blancos.  Lo ms probable es que esto sea compatible con un clculo en el sistema urinario.)  Urine culture is sent to make sure there is no infection.  Our staff will call you if there is any sign of infection in the urine and send in treatment. (Se enva un urocultivo para asegurarse de que no haya infeccin.  Nuestro personal lo llamar si hay algn signo de infeccin en la orina y le enviar tratamiento.)  You have been given a shot of Toradol  30 mg today. (Hoy le han administrado una inyeccin de Toradol  30 mg.)  Ketorolac  10 mg tablets--take 1 tablet every 6 hours as needed for pain.  This is the same medicine that is in the shot we just gave you. (Tabletas de ketorolaco de 10 mg: tome 1 tableta cada 6 horas segn sea necesario para el dolor.  Este es el mismo medicamento que est en la inyeccin que le acabamos de dar.)  Tamsulosin  0.4 mg--1 daily.  This is to help urinary flow.  (Tamsulosina 0,4 mg: 1 al da.  Esto es para ayudar al flujo urinario.)  Please make a follow-up appointment with your primary care about this issue; may need further testing. (Programe una cita de seguimiento con su atencin primaria sobre este tema; puede necesitar ms pruebas)   If you worsen in any way, please go to the emergency room for further evaluation. (Si empeora de alguna manera, vaya a la sala de emergencias para una evaluacin adicional.)

## 2023-12-18 NOTE — ED Provider Notes (Signed)
 [facility]    CSN: [id number] Arrival date & time: 12/18/23  1013      History   Chief Complaint Chief Complaint  Patient presents with   Abdominal Pain    HPI Michele Stephens is a 33 y.o. female.    Abdominal Pain Here for left lower quadrant pain that awoke her suddenly at 2 AM today.  She has not had any urinary frequency or hematuria or dysuria.  She is not constipated and last bowel movement was this morning and was normal no blood in the stool.  Last menstrual cycle was December 22.  Periods have been regular  She is no longer breast-feeding  NKDA  She does have some nausea most mornings for the last several months.  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 Authorizing Provider  Evening Primrose Oil 1000 MG CAPS Take 4,000 mg by mouth in the morning and at bedtime.    [provider]  Evening Primrose Oil 500 MG CAPS Take 4 capsules by mouth 2 (two) times daily.    [provider]  Multiple Vitamin (MULTIVITAMIN) capsule Take 1 capsule by mouth daily.    [provider]    Family History History reviewed. No pertinent family history.  Social History Social History   Tobacco Use   Smoking status: Never    Smokeless tobacco: Never  Vaping Use   Vaping status: Never Used  Substance Use Topics   Alcohol use: No   Drug use: No     Allergies   Patient has no known allergies.   Review of Systems Review of Systems  Gastrointestinal:  Positive for abdominal pain.     Physical Exam Triage Vital Signs ED Triage Vitals  Encounter Vitals Group     BP 12/18/23 1157 127/70     Systolic BP Percentile --      Diastolic BP Percentile --      Pulse Rate 12/18/23 1157 79     Resp 12/18/23 1157 18     Temp 12/18/23 1157 97.6 F (36.4 C)     Temp Source 12/18/23 1157 Oral     SpO2 12/18/23 1157 98 %     Weight --      Height --      Head Circumference --      Peak Flow --      Pain Score 12/18/23 1158 8  Pain Loc --      Pain Education --      Exclude from Growth Chart --    No data found.  Updated Vital Signs BP 127/70 (BP Location: Right Arm)   Pulse 79   Temp 97.6 F (36.4 C) (Oral)   Resp 18   LMP 12/03/2023   SpO2 98%   Breastfeeding No   Visual Acuity Right Eye Distance:   Left Eye Distance:   Bilateral Distance:    Right Eye Near:   Left Eye Near:    Bilateral Near:     Physical Exam Vitals reviewed.  Constitutional:      General: She is not in acute distress.    Appearance: She is not ill-appearing, toxic-appearing or diaphoretic.  HENT:     Mouth/Throat:     Mouth: Mucous membranes are moist.  Eyes:     Extraocular Movements: Extraocular movements intact.     Conjunctiva/sclera: Conjunctivae normal.     Pupils: Pupils are equal, round, and reactive to light.  Cardiovascular:     Rate and Rhythm: Normal rate and regular rhythm.  Pulmonary:     Effort: Pulmonary effort is normal.     Breath sounds: Normal breath sounds.  Abdominal:     General: There is no distension.     Palpations: Abdomen is soft. There is no mass.     Tenderness: There is no guarding.     Comments: There is very mild tenderness in the left lower quadrant, without any  guarding.  Musculoskeletal:     Cervical back: Neck supple.  Lymphadenopathy:     Cervical: No cervical adenopathy.  Skin:    Coloration: Skin is not jaundiced or pale.  Neurological:     General: No focal deficit present.     Mental Status: She is alert and oriented to person, place, and time.  Psychiatric:        Behavior: Behavior normal.      UC Treatments / Results  Labs (all labs ordered are listed, but only abnormal results are displayed) Labs Reviewed  POCT URINALYSIS DIP (MANUAL ENTRY) - Abnormal; Notable for the following components:      Result Value   Blood, UA large (*)    Leukocytes, UA Trace (*)    All other components within normal limits  POCT URINE PREGNANCY    EKG   Radiology No results found.  Procedures Procedures (including critical care time)  Medications Ordered in UC Medications - No data to display  Initial Impression / Assessment and Plan / UC Course  I have reviewed the triage vital signs and the nursing notes.  Pertinent labs & imaging results that were available during my care of the patient were reviewed by me and considered in my medical decision making (see chart for details).     UPT is negative.  Urinalysis shows a large amount of RBCs and trace of leukocytes.  No nitrites.  Urine culture is sent, and we will notify her and treatment protocol if positive  For now I am going to treat for kidney stone.  Toradol  injections given here and Toradol  tablets are sent to pharmacy.  Flomax  is sent in also.  Of asked her to follow-up with her primary care   Final Clinical Impressions(s) / UC Diagnoses   Final diagnoses:  None   Discharge Instructions   None    ED Prescriptions   None    PDMP not reviewed this encounter.   Vonna Males  K, MD 12/18/23 1210

## 2023-12-19 ENCOUNTER — Emergency Department (HOSPITAL_COMMUNITY)
Admission: EM | Admit: 2023-12-19 | Discharge: 2023-12-20 | Disposition: A | Discharge status: Home / Self Care | Payer: Self-pay | Attending: Emergency Medicine | Admitting: Emergency Medicine

## 2023-12-19 ENCOUNTER — Ambulatory Visit (HOSPITAL_COMMUNITY)
Admission: EM | Admit: 2023-12-19 | Discharge: 2023-12-19 | Disposition: A | Discharge status: Home / Self Care | Payer: Self-pay | Attending: Family Medicine | Admitting: Family Medicine

## 2023-12-19 ENCOUNTER — Encounter (HOSPITAL_COMMUNITY): Payer: Self-pay

## 2023-12-19 DIAGNOSIS — N200 Calculus of kidney: Secondary | ICD-10-CM

## 2023-12-19 DIAGNOSIS — R1032 Left lower quadrant pain: Secondary | ICD-10-CM

## 2023-12-19 DIAGNOSIS — R112 Nausea with vomiting, unspecified: Secondary | ICD-10-CM

## 2023-12-19 DIAGNOSIS — D72829 Elevated white blood cell count, unspecified: Secondary | ICD-10-CM | POA: Insufficient documentation

## 2023-12-19 DIAGNOSIS — N132 Hydronephrosis with renal and ureteral calculous obstruction: Secondary | ICD-10-CM | POA: Insufficient documentation

## 2023-12-19 LAB — URINALYSIS, ROUTINE W REFLEX MICROSCOPIC
Bilirubin Urine: NEGATIVE
Glucose, UA: NEGATIVE mg/dL
Ketones, ur: 5 mg/dL — AB
Nitrite: NEGATIVE
Protein, ur: NEGATIVE mg/dL
Specific Gravity, Urine: 1.008 (ref 1.005–1.030)
pH: 7 (ref 5.0–8.0)

## 2023-12-19 LAB — URINE CULTURE: Culture: <10000 — AB

## 2023-12-19 LAB — COMPREHENSIVE METABOLIC PANEL
ALT: 24 U/L (ref 0–44)
AST: 23 U/L (ref 15–41)
Albumin: 4.2 g/dL (ref 3.5–5.0)
Alkaline Phosphatase: 57 U/L (ref 38–126)
Anion gap: 9 (ref 5–15)
BUN: 9 mg/dL (ref 6–20)
CO2: 24 mmol/L (ref 22–32)
Calcium: 9.2 mg/dL (ref 8.9–10.3)
Chloride: 104 mmol/L (ref 98–111)
Creatinine, Ser: 0.8 mg/dL (ref 0.44–1.00)
GFR, Estimated: >60 mL/min (ref >60)
Glucose, Bld: 103 mg/dL — ABNORMAL HIGH (ref 70–99)
Potassium: 3.7 mmol/L (ref 3.5–5.1)
Sodium: 137 mmol/L (ref 135–145)
Total Bilirubin: 1.1 mg/dL (ref 0.0–1.2)
Total Protein: 7.1 g/dL (ref 6.5–8.1)

## 2023-12-19 LAB — CBC
HCT: 37.4 % (ref 36.0–46.0)
Hemoglobin: 12.6 g/dL (ref 12.0–15.0)
MCH: 31.4 pg (ref 26.0–34.0)
MCHC: 33.7 g/dL (ref 30.0–36.0)
MCV: 93.3 fL (ref 80.0–100.0)
Platelets: 285 10*3/uL (ref 150–400)
RBC: 4.01 MIL/uL (ref 3.87–5.11)
RDW: 12 % (ref 11.5–15.5)
WBC: 11.8 10*3/uL — ABNORMAL HIGH (ref 4.0–10.5)
nRBC: 0 % (ref 0.0–0.2)

## 2023-12-19 LAB — LIPASE, BLOOD: Lipase: 26 U/L (ref 11–51)

## 2023-12-19 LAB — HCG, SERUM, QUALITATIVE: Preg, Serum: NEGATIVE

## 2023-12-19 NOTE — ED Notes (Signed)
 Patient is being discharged from the Urgent Care and sent to the Emergency Department via self . Per provider, patient is in need of higher level of care due to LLQ abd pain. Patient is aware and verbalizes understanding of plan of care.  Vitals:   12/19/23 1544  BP: 121/79  Pulse: 77  Resp: 14  Temp: 98.3 F (36.8 C)  SpO2: 98%

## 2023-12-19 NOTE — ED Triage Notes (Signed)
 Interpreter Luis # [phone]  C/O LLQ pain x 3 days. Nausea x 3 days today vomited. Sent from [facility] for further evaluation

## 2023-12-19 NOTE — Discharge Instructions (Signed)
 She is going to the emergency room

## 2023-12-19 NOTE — ED Provider Notes (Signed)
 [facility]    CSN: [id number] Arrival date & time: 12/19/23  1459      History   Chief Complaint Chief Complaint  Patient presents with   Abdominal Pain    HPI Michele Stephens is a 33 y.o. female.    Abdominal Pain Here for worsening abdominal pain and now nausea and vomiting.  I saw her yesterday and she had some left lower quadrant pain that it began suddenly earlier in the morning.  Urinalysis showed some red blood cells and I felt her exam and history consistent with a renal stone.  UPT was negative.  She has been worsening with worse pain in the last 24 hours and has begun to vomit.  Urine culture that was sent yesterday showed insignificant amount of bacteria and so was negative.   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 Authorizing Provider  Evening Primrose Oil 1000 MG CAPS Take 4,000 mg by mouth in the morning and at bedtime.    [provider]  Evening Primrose Oil 500 MG CAPS Take 4 capsules by mouth 2 (two) times daily.    [provider]  ketorolac  (TORADOL ) 10 MG tablet Take 1 tablet (10 mg total) by mouth every 6 (six) hours as needed (pain). 12/18/23   Sampson Self K, MD  Multiple Vitamin (MULTIVITAMIN) capsule Take 1 capsule by mouth daily.    [provider]  tamsulosin  (FLOMAX ) 0.4 MG CAPS capsule Take 1 capsule (0.4 mg total) by  mouth daily. 12/18/23   Vonna Sharlet POUR, MD    Family History History reviewed. No pertinent family history.  Social History Social History   Tobacco Use   Smoking status: Never   Smokeless tobacco: Never  Vaping Use   Vaping status: Never Used  Substance Use Topics   Alcohol use: No   Drug use: No     Allergies   Patient has no known allergies.   Review of Systems Review of Systems  Gastrointestinal:  Positive for abdominal pain.     Physical Exam Triage Vital Signs ED Triage Vitals  Encounter Vitals Group     BP 12/19/23 1544 121/79     Systolic BP Percentile --      Diastolic BP Percentile --      Pulse Rate 12/19/23 1544 77     Resp 12/19/23 1544 14     Temp 12/19/23 1544 98.3 F (36.8 C)     Temp Source 12/19/23 1544 Oral     SpO2 12/19/23 1544 98 %     Weight 12/19/23 1550 130 lb (  59 kg)     Height --      Head Circumference --      Peak Flow --      Pain Score 12/19/23 1550 10     Pain Loc --      Pain Education --      Exclude from Growth Chart --    No data found.  Updated Vital Signs BP 121/79 (BP Location: Right Arm)   Pulse 77   Temp 98.3 F (36.8 C) (Oral)   Resp 14   Wt 59 kg   LMP 12/03/2023 (Approximate)   SpO2 98%   Breastfeeding No   BMI 26.26 kg/m   Visual Acuity Right Eye Distance:   Left Eye Distance:   Bilateral Distance:    Right Eye Near:   Left Eye Near:    Bilateral Near:     Physical Exam Vitals reviewed.  Constitutional:      Appearance: She is not toxic-appearing or diaphoretic.     Comments: She is sitting on the exam table holding her left side with a pained facies  HENT:     Mouth/Throat:     Mouth: Mucous membranes are moist.  Eyes:     Extraocular Movements: Extraocular movements intact.     Conjunctiva/sclera: Conjunctivae normal.     Pupils: Pupils are equal, round, and reactive to light.  Cardiovascular:     Rate and Rhythm: Normal rate and regular rhythm.     Heart sounds: No murmur  heard. Pulmonary:     Effort: Pulmonary effort is normal.     Breath sounds: Normal breath sounds.  Abdominal:     Palpations: Abdomen is soft.     Tenderness: There is abdominal tenderness (LLQ).  Musculoskeletal:     Cervical back: Neck supple.  Lymphadenopathy:     Cervical: No cervical adenopathy.  Skin:    Coloration: Skin is not pale.  Neurological:     Mental Status: She is alert and oriented to person, place, and time.  Psychiatric:        Behavior: Behavior normal.      UC Treatments / Results  Labs (all labs ordered are listed, but only abnormal results are displayed) Labs Reviewed - No data to display  EKG   Radiology No results found.  Procedures Procedures (including critical care time)  Medications Ordered in UC Medications - No data to display  Initial Impression / Assessment and Plan / UC Course  I have reviewed the triage vital signs and the nursing notes.  Pertinent labs & imaging results that were available during my care of the patient were reviewed by me and considered in my medical decision making (see chart for details).   I have asked her to proceed to the emergency room for further evaluation that we cannot accomplish for here in the clinic.  She is agreeable and I showed her how to get to the emergency room walking across our parking lot. Final Clinical Impressions(s) / UC Diagnoses   Final diagnoses:  None   Discharge Instructions   None    ED Prescriptions   None    PDMP not reviewed this encounter.   Vonna Sharlet POUR, MD 12/19/23 [phone]

## 2023-12-19 NOTE — ED Triage Notes (Signed)
 Patient here today with c/o LLQ abd pain. Patient was here yesterday but that pain has worsened. He has been taking Tamsulosin and Ketorolac but not having any relief. She has also been having nausea and vomiting.

## 2023-12-20 ENCOUNTER — Emergency Department (HOSPITAL_COMMUNITY): Payer: Self-pay

## 2023-12-20 MED ORDER — ONDANSETRON HCL 4 MG PO TABS: 4.0000 mg | ORAL_TABLET | Freq: Four times a day (QID) | ORAL | 0 refills | Status: DC

## 2023-12-20 MED ORDER — IOHEXOL 350 MG/ML SOLN
75.0000 mL | Freq: Once | INTRAVENOUS | Status: AC | PRN
  Administered 2023-12-20: 75 mL via INTRAVENOUS

## 2023-12-20 MED ORDER — OXYCODONE-ACETAMINOPHEN 5-325 MG PO TABS
1.0000 | ORAL_TABLET | Freq: Once | ORAL | Status: AC
  Administered 2023-12-20: 1 via ORAL
  Filled 2023-12-20: qty 1

## 2023-12-20 MED ORDER — FAMOTIDINE 20 MG PO TABS
20.0000 mg | ORAL_TABLET | Freq: Once | ORAL | Status: AC
Start: 2023-12-20 — End: 2023-12-20
  Administered 2023-12-20: 20 mg via ORAL
  Filled 2023-12-20: qty 1

## 2023-12-20 MED ORDER — ALUM & MAG HYDROXIDE-SIMETH 200-200-20 MG/5ML PO SUSP
30.0000 mL | Freq: Once | ORAL | Status: AC
  Administered 2023-12-20: 30 mL via ORAL
  Filled 2023-12-20: qty 30

## 2023-12-20 MED ORDER — OXYCODONE HCL 5 MG PO TABS: 5.0000 mg | ORAL_TABLET | Freq: Four times a day (QID) | ORAL | 0 refills | Status: DC | PRN

## 2023-12-20 NOTE — Discharge Instructions (Addendum)
 You were seen in the emergency department for your abdominal pain.  Your workup showed that you have a kidney stone.  This is small enough that you should be able to pass it on your own.  You were given a prescription at urgent care for Flomax  that you should take daily until you pass the stone as this will help you pass the stone and Toradol  that you can take as needed for pain in addition to Tylenol  and I have given you prescription of oxycodone .  This can make you drowsy so do not take it while driving, working or operating heavy machinery.  You can take Zofran  as needed for nausea.  You can follow-up with urology to have your symptoms rechecked and to make sure that you pass the stone.  You should return to the emergency department if you are having uncontrolled pain despite your medication, continued vomiting despite the nausea meds, you have a fever.  Or if you have any other new or concerning symptoms.  Lo atendieron en el departamento de emergencias por su dolor abdominal. Su evaluacin mostr que tiene un clculo renal. Este es lo suficientemente pequeo como para que pueda expulsarlo por s solo. En el centro de atencin de urgencias le dieron una receta para Flomax  que debe tomar diariamente hasta que expulse el clculo, ya que esto lo ayudar a expulsarlo y Toradol  que puede tomar segn sea necesario para el dolor, adems de Tylenol , y [address] una receta de oxicodona. Esto puede causarle somnolencia, as que no lo tome mientras conduce, trabaja u opera maquinaria pesada. Puede tomar Zofran  segn sea necesario para las nuseas. Puede hacer un seguimiento con urologa para que le vuelvan a controlar los sntomas y asegurarse de production designer, theatre/television/film clculo. Debe regresar al departamento de emergencias si tiene dolor incontrolable a pesar de su medicacin, vmitos continuos a pesar de los medicamentos para las nuseas, tiene fiebre o si tiene otros sntomas nuevos o preocupantes.

## 2023-12-20 NOTE — ED Notes (Addendum)
Refused Vitals 

## 2023-12-20 NOTE — ED Provider Notes (Signed)
 [place] [facility]ENC[facility]PARTMENT AT [facility] Provider Note   CSN: [id number] Arrival date & time: 12/19/23  1603     History  Chief Complaint  Patient presents with   Abdominal Pain    Michele Stephens is a 33 y.o. female.  Patient is a 33 year old female with no significant past medical history presenting to the emergency department with left lower quadrant pain.  Patient reports that she has had pain since Sunday and it has been constant since it started.  She states that she has had nausea and yesterday started to vomit.  She denies any diarrhea or constipation, dysuria or hematuria, abnormal vaginal bleeding or discharge.  She states that she went to urgent care who recommended that she come to the ER for further evaluation due to the worsening of her pain.  She denies any prior abdominal surgeries.  Patient also is complaining of burning sensation on the right side of her throat this morning that she states has been ongoing for the last year.  The history is provided by the patient. A language interpreter was used (Spanish GLENWOOD Ferrier [phone]).  Abdominal Pain      Home Medications Prior to Admission medications   Medication Sig Start Date End Date Taking? Authorizing Provider  ondansetron  (ZOFRAN ) 4 MG tablet Take 1 tablet (4 mg total) by mouth every 6 (six) hours. 12/20/23  Yes Ellouise, Linn Clavin K, DO  oxyCODONE  (ROXICODONE ) 5 MG immediate release tablet Take 1 tablet (5 mg total) by mouth every 6 (six) hours as needed. 12/20/23  Yes Kingsley, Destry Dauber K, DO  Evening Primrose Oil 1000 MG CAPS Take 4,000 mg by mouth in the morning and at bedtime.    [provider]  Evening Primrose Oil 500 MG CAPS Take 4 capsules by mouth 2 (two) times daily.    [provider]  ketorolac  (TORADOL ) 10 MG tablet Take 1 tablet (10 mg total) by mouth every 6 (six) hours as needed (pain). 12/18/23   Banister, Pamela K, MD  Multiple Vitamin (MULTIVITAMIN) capsule Take 1  capsule by mouth daily.    [provider]  tamsulosin  (FLOMAX ) 0.4 MG CAPS capsule Take 1 capsule (0.4 mg total) by mouth daily. 12/18/23   Banister, Pamela K, MD      Allergies    Patient has no known allergies.    Review of Systems   Review of Systems  Gastrointestinal:  Positive for abdominal pain.    Physical Exam Updated Vital Signs BP (!) 109/57 (BP Location: Right Arm)   Pulse 67   Temp 98 F (36.7 C) (Oral)   Resp 18   Ht 4' 11 (1.499 m)   Wt 59 kg   LMP 12/03/2023 (Approximate)   SpO2 100%   BMI 26.27 kg/m  Physical Exam Vitals and nursing note reviewed.  Constitutional:      General: She is not in acute distress.    Appearance: She is well-developed.  HENT:     Head: Normocephalic and atraumatic.     Mouth/Throat:     Mouth: Mucous membranes are moist.  Eyes:     Extraocular Movements: Extraocular movements intact.  Cardiovascular:     Rate and Rhythm: Normal rate and regular rhythm.  Pulmonary:     Effort: Pulmonary effort is normal.     Breath sounds: Normal breath sounds.  Abdominal:     General: Abdomen is flat.     Palpations: Abdomen is soft.     Tenderness: There is  abdominal tenderness in the left lower quadrant. There is no right CVA tenderness, left CVA tenderness, guarding or rebound.  Skin:    General: Skin is warm and dry.  Neurological:     General: No focal deficit present.     Mental Status: She is alert and oriented to person, place, and time.  Psychiatric:        Mood and Affect: Mood normal.        Behavior: Behavior normal.     ED Results / Procedures / Treatments   Labs (all labs ordered are listed, but only abnormal results are displayed) Labs Reviewed  COMPREHENSIVE METABOLIC PANEL - Abnormal; Notable for the following components:      Result Value   Glucose, Bld 103 (*)    All other components within normal limits  CBC - Abnormal; Notable for the following components:   WBC 11.8 (*)    All other components  within normal limits  URINALYSIS, ROUTINE W REFLEX MICROSCOPIC - Abnormal; Notable for the following components:   Hgb urine dipstick MODERATE (*)    Ketones, ur 5 (*)    Leukocytes,Ua SMALL (*)    Bacteria, UA RARE (*)    All other components within normal limits  LIPASE, BLOOD  HCG, SERUM, QUALITATIVE    EKG None  Radiology CT ABDOMEN PELVIS W CONTRAST Result Date: 12/20/2023 CLINICAL DATA:  Three day history of left lower quadrant pain and nausea EXAM: CT ABDOMEN AND PELVIS WITH CONTRAST TECHNIQUE: Multidetector CT imaging of the abdomen and pelvis was performed using the standard protocol following bolus administration of intravenous contrast. RADIATION DOSE REDUCTION: This exam was performed according to the departmental dose-optimization program which includes automated exposure control, adjustment of the mA and/or kV according to patient size and/or use of iterative reconstruction technique. CONTRAST:  75mL OMNIPAQUE  IOHEXOL  350 MG/ML SOLN COMPARISON:  None Available. FINDINGS: Lower chest: No focal consolidation or pulmonary nodule in the lung bases. No pleural effusion or pneumothorax demonstrated. Partially imaged heart size is normal. Hepatobiliary: No focal hepatic lesions. No intra or extrahepatic biliary ductal dilation. Normal gallbladder. Pancreas: No focal lesions or main ductal dilation. Spleen: Normal in size without focal abnormality. Adrenals/Urinary Tract: No adrenal nodules. Mild left hydroureteronephrosis to the proximal left ureter where there is a 4 mm stone (3:40). No suspicious renal masses. No focal bladder wall thickening. Stomach/Bowel: Normal appearance of the stomach. No evidence of bowel wall thickening, distention, or inflammatory changes. Normal appendix. Vascular/Lymphatic: No significant vascular findings are present. No enlarged abdominal or pelvic lymph nodes. Reproductive: No adnexal masses. Other: Trace pelvic free fluid.  No free air or fluid collection.  Musculoskeletal: No acute or abnormal lytic or blastic osseous lesions. IMPRESSION: Mild left hydroureteronephrosis to a 4 mm proximal left ureteral stone. Electronically Signed   By: Limin  Xu M.D.   On: 12/20/2023 11:27    Procedures Procedures    Medications Ordered in ED Medications  oxyCODONE -acetaminophen  (PERCOCET/ROXICET) 5-325 MG per tablet 1 tablet (1 tablet Oral Given 12/20/23 0332)  famotidine  (PEPCID ) tablet 20 mg (20 mg Oral Given 12/20/23 0728)  alum & mag hydroxide-simeth (MAALOX/MYLANTA) 200-200-20 MG/5ML suspension 30 mL (30 mLs Oral Given 12/20/23 0728)  iohexol  (OMNIPAQUE ) 350 MG/ML injection 75 mL (75 mLs Intravenous Contrast Given 12/20/23 1104)    ED Course/ Medical Decision Making/ A&P Clinical Course as of 12/20/23 1155  Wed Dec 20, 2023  1130 CTAP with 4mm stone with mild hydronephrosis. [VK]    Clinical Course User Index [  VK] Kingsley, Nilan Iddings K, DO                                 Medical Decision Making This patient presents to the ED with chief complaint(s) of LLQ pain with no pertinent past medical history which further complicates the presenting complaint. The complaint involves an extensive differential diagnosis and also carries with it a high risk of complications and morbidity.    The differential diagnosis includes nephrolithiasis, diverticulitis, ovarian cyst or torsion, UTI, no abnormal discharge making STI or PID less likely, other intra-abdominal infection, negative pregnancy test making ectopic unlikely  Additional history obtained: Additional history obtained from N/A Records reviewed urgent care records, primary care records - neck pain has had extensive work up, thought to be neuropathic pain  ED Course and Reassessment: On patient's arrival she is hemodynamically stable in no acute distress.  Was initially evaluated in triage and had labs and urine performed.  Showed mild leukocytosis and small amount of leuks and RBCs in her urine with rare  bacteria, no urinary symptoms making UTI less likely.  Due to patient's ongoing pain will have CT abdomen and pelvis to evaluate for nephrolithiasis or other intra-abdominal infection.  She declined any additional pain control at this time and will be closely reassessed.  Independent labs interpretation:  The following labs were independently interpreted: leukocytosis, hematuria and small leuks in the urine  Independent visualization of imaging: - I independently visualized the following imaging with scope of interpretation limited to determining acute life threatening conditions related to emergency care: CTAP, which revealed 4mm L ureteral stone with mild hydro  Consultation: - Consulted or discussed management/test interpretation w/ external professional: N/A  Consideration for admission or further workup: Patient has no emergent conditions requiring admission or further work-up at this time and is stable for discharge home with primary care and urology follow-up  Social Determinants of health: N/A    Amount and/or Complexity of Data Reviewed Labs: ordered. Radiology: ordered.  Risk OTC drugs. Prescription drug management.          Final Clinical Impression(s) / ED Diagnoses Final diagnoses:  Nephrolithiasis    Rx / DC Orders ED Discharge Orders          Ordered    oxyCODONE  (ROXICODONE ) 5 MG immediate release tablet  Every 6 hours PRN        12/20/23 1153    ondansetron  (ZOFRAN ) 4 MG tablet  Every 6 hours        12/20/23 1153              Caballo, Denarius Sesler K, DO 12/20/23 1155

## 2024-02-25 ENCOUNTER — Other Ambulatory Visit: Payer: Self-pay

## 2024-02-25 ENCOUNTER — Ambulatory Visit (HOSPITAL_COMMUNITY)
Admission: EM | Admit: 2024-02-25 | Discharge: 2024-02-25 | Disposition: A | Discharge status: Home / Self Care | Payer: Self-pay | Attending: Family Medicine | Admitting: Family Medicine

## 2024-02-25 ENCOUNTER — Encounter (HOSPITAL_COMMUNITY): Payer: Self-pay | Admitting: *Deleted

## 2024-02-25 DIAGNOSIS — R300 Dysuria: Secondary | ICD-10-CM

## 2024-02-25 DIAGNOSIS — N3010 Interstitial cystitis (chronic) without hematuria: Secondary | ICD-10-CM

## 2024-02-25 LAB — POCT URINALYSIS DIP (MANUAL ENTRY)
Bilirubin, UA: NEGATIVE
Glucose, UA: NEGATIVE mg/dL
Ketones, POC UA: NEGATIVE mg/dL
Leukocytes, UA: NEGATIVE
Nitrite, UA: NEGATIVE
Protein Ur, POC: NEGATIVE mg/dL
Spec Grav, UA: 1.015 (ref 1.010–1.025)
Urobilinogen, UA: 0.2 U/dL
pH, UA: 7.5 (ref 5.0–8.0)

## 2024-02-25 MED ORDER — PHENAZOPYRIDINE HCL 100 MG PO TABS: 100.0000 mg | ORAL_TABLET | Freq: Three times a day (TID) | ORAL | 0 refills | Status: AC | PRN

## 2024-02-25 NOTE — ED Provider Notes (Signed)
 [facility]    CSN: [id number] Arrival date & time: 02/25/24  1032      History   Chief Complaint Chief Complaint  Patient presents with   Dysuria    HPI Michele Stephens is a 33 y.o. female.   The history is provided by the patient. The history is limited by a language barrier. A language interpreter was used Garment/textile technologist [id number]).  Dysuria Pain quality:  Burning and stabbing (Stabbing pain after urinating) Duration:  1 month Timing:  Intermittent Progression:  Worsening Chronicity:  New Recent urinary tract infections: no   Relieved by:  Nothing Worsened by:  Nothing Ineffective treatments: Was given a medicine in Jan for bladder/kidney stone. Urinary symptoms: no discolored urine, no foul-smelling urine, no frequent urination, no hematuria and no hesitancy   Associated symptoms: abdominal pain   Associated symptoms: no fever, no flank pain, no nausea, no vaginal discharge and no vomiting   Associated symptoms comment:  Left groin pain x 1 month. She denies any vaginal bleeding or discharge at this time. Risk factors: hx of urolithiasis   Risk factors: not pregnant   Risk factors comment:  LMP: 02/14/23. Not on birth control and is sexually active. Pregnancy prevention by condom. No sex since her LMP and she declined urine pregnancy test today. Hx of kidney stone Er most recent menses, she had mucus coming with her bleeding and does not know if this was miscarriage or not.   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 Authorizing Provider  phenazopyridine (PYRIDIUM) 100 MG tablet Take 1 tablet (100 mg total) by mouth 3 (three) times daily as needed for up to 2 days for pain. 02/25/24 02/27/24 Yes Doreene Eland, MD  Evening Primrose Oil 1000 MG CAPS Take 4,000 mg by mouth in the morning and at bedtime.    [provider]  Evening Primrose Oil 500 MG CAPS Take 4 capsules by mouth 2 (two) times daily.    [provider]  ketorolac (TORADOL) 10 MG tablet Take 1 tablet (10 mg total) by mouth every 6 (six) hours as needed (pain). 12/18/23   Zenia Resides, MD  Multiple Vitamin (MULTIVITAMIN) capsule Take 1 capsule by mouth daily.    [provider]  ondansetron (ZOFRAN) 4 MG tablet Take 1 tablet (4 mg total) by mouth every 6 (six) hours. 12/20/23   Elayne Snare K, DO  oxyCODONE (ROXICODONE) 5 MG immediate release tablet Take 1 tablet (5 mg total) by mouth every 6 (six) hours as needed. 12/20/23   Rexford Maus, DO  tamsulosin (  FLOMAX) 0.4 MG CAPS capsule Take 1 capsule (0.4 mg total) by mouth daily. 12/18/23   Zenia Resides, MD    Family History History reviewed. No pertinent family history.  Social History Social History   Tobacco Use   Smoking status: Never   Smokeless tobacco: Never  Vaping Use   Vaping status: Never Used  Substance Use Topics   Alcohol use: No   Drug use: No     Allergies   Patient has no known allergies.   Review of Systems Review of Systems  Constitutional:  Negative for fever.  Gastrointestinal:  Positive for abdominal pain. Negative for nausea and vomiting.  Genitourinary:  Positive for dysuria. Negative for flank pain and vaginal discharge.     Physical Exam Triage Vital Signs ED Triage Vitals  Encounter Vitals Group     BP 02/25/24 1127 (!) 93/53     Systolic BP Percentile --       Diastolic BP Percentile --      Pulse Rate 02/25/24 1127 76     Resp 02/25/24 1127 18     Temp 02/25/24 1127 98.1 F (36.7 C)     Temp src --      SpO2 02/25/24 1127 97 %     Weight --      Height --      Head Circumference --      Peak Flow --      Pain Score 02/25/24 1125 5     Pain Loc --      Pain Education --      Exclude from Growth Chart --    No data found.  Updated Vital Signs BP (!) 93/53   Pulse 76   Temp 98.1 F (36.7 C)   Resp 18   LMP 02/14/2024   SpO2 97%   Visual Acuity Right Eye Distance:   Left Eye Distance:   Bilateral Distance:    Right Eye Near:   Left Eye Near:    Bilateral Near:     Physical Exam Vitals and nursing note reviewed.  Cardiovascular:     Rate and Rhythm: Normal rate and regular rhythm.     Heart sounds: Normal heart sounds. No murmur heard. Pulmonary:     Effort: Pulmonary effort is normal. No respiratory distress.     Breath sounds: Normal breath sounds. No wheezing.  Abdominal:     General: Abdomen is flat. Bowel sounds are normal. There is no distension.     Palpations: Abdomen is soft. There is no mass.     Tenderness: There is no abdominal tenderness.      UC Treatments / Results  Labs (all labs ordered are listed, but only abnormal results are displayed) Labs Reviewed  POCT URINALYSIS DIP (MANUAL ENTRY) - Abnormal; Notable for the following components:      Result Value   Blood, UA small (*)    All other components within normal limits  URINE CULTURE    EKG   Radiology No results found.  Procedures Procedures (including critical care time)  Medications Ordered in UC Medications - No data to display  Initial Impression / Assessment and Plan / UC Course  I have reviewed the triage vital signs and the nursing notes.  Pertinent labs & imaging results that were available during my care of the patient were reviewed by me and considered in my medical decision making (see chart for  details).  Clinical Course as of 02/25/24 1228  Sun Feb 25, 2024  1226 Urine positive for blood. No signs of infection Urine culture ordered Pyridium ordered for bladder pain CT scan from Jan 2025 reviewed - Mild left hydroureteronephrosis to a 4 mm proximal left ureteral stone. I advised her that she might need reassessment with possible surgical intervention, which we are unable to offer at the [facility]. Keep well hydrated and f/u with PCP and /or urologist for reassessment and management of her renal stone. Holding off on Flomax till she is able to confirm persistent renal stone as she denies flank pain, but endorses bladder pain ED precautions discussed.  The patient was teary and frustrated as she thought she could get a repeat CT scan/US at the ED.  [KE]    Clinical Course User Index [KE] Doreene Eland, MD    Final Clinical Impressions(s) / UC Diagnoses   Final diagnoses:  Dysuria  Interstitial cystitis     Discharge Instructions      It was nice seeing you. Your urine test is negative for infection. However, positive for small blood. This could be due to your kidney stone. Please follow up with your PCP and urologist for treatment of this. I have sent you medications to help with bladder pain. Please keep well hydrated.    Me alegr verte. Tu anlisis de orina dio negativo para infeccin. Sin embargo, dio positivo para una pequea cantidad de [place]. Esto podra deberse a un clculo renal. Por favor, consulta con tu mdico de cabecera y urlogo para recibir tratamiento. Te he recetado medicamentos para el dolor de vejiga. Por favor, mantente bien hidratado.     ED Prescriptions     Medication Sig Dispense Auth. Provider   phenazopyridine (PYRIDIUM) 100 MG tablet Take 1 tablet (100 mg total) by mouth 3 (three) times daily as needed for up to 2 days for pain. 6 tablet Doreene Eland, MD      PDMP not reviewed this encounter.   Doreene Eland, MD 02/25/24  1228

## 2024-02-25 NOTE — ED Triage Notes (Signed)
 PT reports dysuria for one month.

## 2024-02-25 NOTE — Discharge Instructions (Addendum)
 It was nice seeing you. Your urine test is negative for infection. However, positive for small blood. This could be due to your kidney stone. Please follow up with your PCP and urologist for treatment of this. I have sent you medications to help with bladder pain. Please keep well hydrated.    Me alegr verte. Tu anlisis de orina dio negativo para infeccin. Sin embargo, dio positivo para una pequea cantidad de [place]. Esto podra deberse a un clculo renal. Por favor, consulta con tu mdico de cabecera y urlogo para recibir tratamiento. Te he recetado medicamentos para el dolor de vejiga. Por favor, mantente bien hidratado.

## 2024-02-26 LAB — URINE CULTURE: Culture: <10000 — AB

## 2024-08-07 DIAGNOSIS — R319 Hematuria, unspecified: Secondary | ICD-10-CM

## 2024-09-18 ENCOUNTER — Other Ambulatory Visit: Payer: Self-pay

## 2024-12-13 ENCOUNTER — Other Ambulatory Visit: Payer: Self-pay

## 2024-12-13 DIAGNOSIS — N6311 Unspecified lump in the right breast, upper outer quadrant: Secondary | ICD-10-CM

## 2024-12-19 ENCOUNTER — Other Ambulatory Visit: Payer: Self-pay

## 2024-12-19 DIAGNOSIS — N6311 Unspecified lump in the right breast, upper outer quadrant: Secondary | ICD-10-CM

## 2024-12-19 DIAGNOSIS — N644 Mastodynia: Secondary | ICD-10-CM

## 2025-01-16 ENCOUNTER — Encounter: Payer: Self-pay | Admitting: Family Medicine

## 2025-01-16 ENCOUNTER — Ambulatory Visit: Payer: Self-pay | Admitting: Family Medicine

## 2025-01-16 VITALS — BP 99/60 | HR 87 | Wt 136.2 lb

## 2025-01-16 DIAGNOSIS — Z3481 Encounter for supervision of other normal pregnancy, first trimester: Secondary | ICD-10-CM

## 2025-01-16 DIAGNOSIS — R110 Nausea: Secondary | ICD-10-CM

## 2025-01-16 DIAGNOSIS — Z8632 Personal history of gestational diabetes: Secondary | ICD-10-CM

## 2025-01-16 MED ORDER — DOXYLAMINE-PYRIDOXINE 10-10 MG PO TBEC: 1.0000 | DELAYED_RELEASE_TABLET | Freq: Every day | ORAL | 1 refills | Status: AC

## 2025-01-16 NOTE — Patient Instructions (Addendum)
 Fue genial verte hoy! Michele Stephens por elegir [facility] para su atencin obsttrica. Michele Stephens fue visto para la visita inicial de obstetricia.  Programe una visita de seguimiento en 4 semanas.  En su prxima visita, le realizaremos una prueba de glucosa de una hora para detectar la diabetes gestacional.  Prenatal Classes Go to [email or web address]  Precauciones de devolucin relacionadas con el embarazo  Los siguientes son signos/sntomas que son anormales en el embarazo y pueden requerir una evaluacin adicional por parte de un mdico: Vaya a MAU en [facility] en [place] si: Tiene calambres/contracciones que no desaparecen con agua potable, especialmente si duran de 30 segundos a 1,5 minutos, aparecen y desaparecen cada 5-10 minutos durante una hora o ms, o si son cada vez ms fuertes y no puede caminar o physiological scientist. Tu agua se rompe. A veces es un gran chorro de lquido, a veces es solo un goteo que sigue mojando tu ropa interior o corriendo por tus piernas. Tiene sangrado vaginal. No siente que su beb se mueva normalmente. Si no lo hace, busque algo para comer y beber (algo fro o algo con azcar baker hughes incorporated de man o jugo) y acustese y concntrese en sentir cmo se mueve su beb. Si su beb todava no se mueve con normalidad, debe ir a MAU. Debe sentir que su beb se mueve 6 veces en una hora o 10 veces en dos horas. Tiene un dolor de cabeza persistente que no desaparece con 1 g de Tylenol , cambios en la visin, dolor en el pecho, dificultad para respirar, dolor intenso en la parte superior derecha del abdomen, empeoramiento de la hinchazn de las piernas; todos estos pueden ser signos de presin arterial alta en el embarazo y necesita para ser evaluado por un proveedor inmediatamente  [place] city son preocupantes durante el psychiatrist y, si tiene alguno de estos, le recomiendo que  llame a su PCP y se presente a Furniture Conservator/restorer de Admisiones de Maternidad (mapa a continuacin) para una evaluacin adicional.  Para cualquier emergencia relacionada con el embarazo, dirjase a la [place] de Admisiones de Maternidad en [address] de [place] y [facility]. Usar la Entrada C del hospital.   El telefono de [place] es [phone].      FUTURAS MADRES Actividad: Las futuras mams deben ser muy diligentes para hacer suficiente ejercicio, ya que mantenerse activa no solo la ayudar a pharmacologist su aumento de peso en un nivel saludable, sino que tambin puede ayudarla a controlar sus sntomas y chief operating officer su cuerpo para el trabajo de [place].  Nutricin: aunque las vitaminas prenatales contienen muchas vitaminas y nutrientes que necesita, es importante reforzar esta necesidad nutricional a travs de una alimentacin saludable. Las mujeres embarazadas deben hacer todo lo posible para comer muchas frutas, verduras y carnes frescas, y automotive engineer comer alimentos demasiado grasos o procesados, ya que el cuerpo funciona mejor solo con combustibles naturales.  Sueo: obtener la cantidad correcta de ZZZ es imperativo durante el embarazo, ya que le permite a su cuerpo copy la fuerza que necesita para ayudar a que el beb se desarrolle de manera saludable y puede ayudarla a controlar sus sntomas ms difciles.  Presin arterial: la presin arterial anormal, ya sea alta o baja, puede ser peligrosa tanto para usted como para el beb durante el [place], por lo que es muy importante controlar sus lecturas de presin arterial para saber que todo est bien.  Opciones de  deteccin gentica para usted NIPT significa prueba prenatal no invasiva. Es una prueba de deteccin que se ofrece durante el embarazo para ver si el feto est en riesgo de tener un trastorno cromosmico como el sndrome de [place] (trisoma 21), trisoma 18 (sndrome de [place]) y trisoma 13 (sndrome de P[place]). La  prueba tambin puede determinar el sexo del feto.   Vaya a MAU en [facility] en [place] si: ? Tiene dolor en la parte inferior del abdomen o en el rea plvica ? Tu agua se rompe. A veces es un gran chorro de lquido, a veces es solo un goteo que sigue mojando tu ropa interior o corriendo por tus piernas. ? Tiene sangrado vaginal.    Michele Stephens [place] naranja garantiza el acceso a la atencin mdica a las personas que cumplen con los requisitos de elegibilidad para los servicios dentro del condado de [place]. Su tarjeta naranja le da acceso a todo lo que [employer] tiene para actor. Cuando califican, los pacientes son zelphia a un hogar mdico para recibir administracin de atencin que incluye remisiones a atencin especializada.  Su tarjeta naranja le da acceso a todo lo que [employer] tiene para actor. Todo lo que necesita hacer para advertising account planner en su atencin mdica es llamar a GCCN al [phone] o visitar su sitio web [email or web address]  REQUISITOS DE ELEGIBILIDAD Si responde S a TODAS las 4 a continuacin, entonces hable con un representante de elegibilidad de GCCN y persona de inscripcin (en el Departamento de Servicios Sociales de [place] y [employer]) que le explicar el programa y determinar si usted califica  1. No tiene un mdico de atencin primaria habitual ([place] mdico). 2. No es elegible para un seguro de salud patrocinado por [address] o el gobierno federal incluyendo el [place] de Seguros Mdicos de ACA (Exencin Requerida), Medicare, Medicaid (excepto una Exencin de Planificacin Familiar) o la Administracin de Advice Worker. 3. Vive en el condado de [place] (nicamente). (mn. 3 meses, preferiblemente 6 meses) 4. Su ingreso anual est entre 0-200% del nivel federal de pobreza  PROGRAMAS DE ASISTENCIA [facility] Programa de Asistencia Financiera (FAP) El programa FAP  es para los residentes de los condados de [place] y [place], [place] y [place] de [place]  que son pacientes sin seguro y han recibido servicios hospitalarios con saldos de cuenta mayores o iguales a $10,000. Los pacientes sin seguro calificados que management consultant en la FAP sern revisados por el Departamento de Asesoramiento Financiero de [employer]. Un representante del ciclo de ingresos revisar la solicitud de FAP del paciente. . Si un paciente coopera plenamente con sydnee hotter y no hay cobertura disponible, se evaluar la cuenta para recibir asistencia financiera en funcin de sus ingresos en comparacin con las pautas federales de pobreza (FPG). Los pacientes con ingresos menores o iguales al 200% de las FPG recibirn un descuento del 100%. Los pacientes entre el 201 % y el 400 % de las FPG calificarn para descuentos parciales. Las opciones de pago estn disponibles para ayudar a los pacientes a pagar su saldo restante.  Programa de puntuacin de asistencia financiera (FAS) El programa de puntaje de asistencia financiera es para los condados de [place], la ciudad de [place], [place] , [place], [place] y [place] de Residentes de [place]  que son pacientes sin seguro y han recibido servicios hospitalarios que dieron como resultado un saldo inferior a $10,000. Cada cuenta se revisar automticamente para obtener un descuento de asistencia  financiera antes de la facturacin. La elegibilidad se basa en un puntaje de asistencia financiera de un proveedor externo que indica la probabilidad de que un paciente viva en la pobreza. A los pacientes con cuentas que califiquen se les extender un ajuste a su cuenta como parte del programa de asistencia financiera. Los pacientes sern notificados por escrito de su estado de elegibilidad en el programa de asistencia financiera.  Los pacientes pueden solicitar el Programa de asistencia financiera descargando una solicitud en  [email or web address], solicitando por correo [facility], [address], [place], [place]. [address]), comunicndose con el servicio de atencin al cliente [phone]), o en persona en cualquier centro de [place].  Una copia de la poltica de Asistencia de Cobertura de Salud y Asistencia Financiera de [facility] est disponible a pedido electrnicamente y/o por correo.  Adems de los programas de asistencia financiera, hay dos programas de descuento disponibles para los pacientes de [facility]:  Descuento para personas sin seguro Los pacientes sin seguro recibirn un descuento united stationers cargos brutos de todos los servicios mdicamente necesarios. Esto se aplica automticamente y no se necesita ninguna accin por parte del paciente para recibir este descuento. El descuento para personas sin seguro est disponible para todos los pacientes sin seguro. Los clculos se basan en el monto generalmente facturado (AGB) a Medicare y las aseguradoras privadas de salud.  Liquidacin de Dificultades Este programa est diseado para ayudar a los condados de [place], la ciudad de [place], [place] , [place], [place] y [place] de [place] . residentes que han tenido un evento mdico catastrfico, independientemente de su cobertura de seguro, que ha resultado en facturas hospitalarias muy altas en comparacin con sus recursos financieros. Los pacientes que hayan incurrido en un saldo de ms de $5,000 despus de todos los pagos del seguro o de terceros y el saldo restante sea mayor al 20 % de los recursos financieros totales de su hogar pueden ser elegibles para un descuento del [place] por dificultades financieras. Los pacientes que busquen un descuento de liquidacin por dificultades deben consultar sobre este programa llamando al departamento de servicio al cliente [phone]) despus de recibir su primer estado de cuenta.  Informacin del contacto: Contabilidad de pacientes de  [facility] [address] Atencin al cliente para [place], [place] del [place] [address] [phone] Atencin: [place] al [facility]  Si an no lo ha hecho, regstrese en My Chart para acceder fcilmente a los resultados de sus anlisis y comunicarse con su mdico de marine scientist.  Estamos revisando algunos laboratorios hoy. Si son anormales, te freight forwarder. Si son normales, transport planner un mensaje de MyChart (si est musician) o una carta por correo. Si no se entera de sus anlisis de laboratorio en las prximas 2 semanas, llame a la oficina.  Debe regresar a nuestra clnica en 4 semanas.  Le recomiendo que siempre traiga sus medicamentos a cada cita, ya que esto facilita asegurarse de que est tomando los medicamentos correctos y nos ayuda a no perder los resurtidos circuit city necesite.  Llegue 15 minutos antes de su cita para garantizar un proceso de registro sin problemas. Agradecemos sus esfuerzos para que esto suceda.  Llame a la clnica al [phone] si sus sntomas empeoran o si tiene alguna inquietud.  Gracias por permitirme participar en su cuidado,  Teodora Baumgarten Toma, MD PGY-2, [facility]

## 2025-01-16 NOTE — Progress Notes (Addendum)
 " Patient Name: Michele Stephens Date of Birth: 02/26/1991 [facility] Initial Prenatal Visit  iPad Spanish interpreter used for duration of visit.  Michele Stephens is a 33 y.o. year old [id number] at Unknown who presents for her initial prenatal visit. Pregnancy is planned She reports morning sickness and nausea. She is taking a prenatal vitamin. She denies pelvic pain or vaginal bleeding.   Pregnancy is planned. She reports morning sickness and nausea. She is taking a prenatal vitamin.  She denies pelvic pain or vaginal bleeding.  STI screening: Partners: Single, monogamous Practices: Vaginal Prior STI: None Pap smear: NILM w/ neg HPV on 10/11/2022 Is not interested in additional screening for sexually transmitted infections today  Pregnancy Dating: The patient is dated by LMP. LMP: 10/29/2024, lasted 5 days Period is certain:  Yes. Periods were regular:  Yes, occur every 30-35 days LMP was a typical period:  Yes.  Using hormonal contraception in 3 months prior to conception: No  PSH: Gynecologic Surgery:  no Surgical history reviewed, no significant history.  Current Medications:  Prenatal vitamins Reviewed and appropriate in pregnancy.  PMH: Reviewed and as detailed below: HTN: No  Gestational Hypertension/preeclampsia: No  Type 1 or 2 Diabetes: No  Depression:  No  Seizure disorder:  No VTE: No History of STI: No Abnormal Pap smear: Yes, NILM in November 2023 Genital herpes simplex:  No   Lab Review: Ordered, will review next visit Varicella status is Immune  Obstetric History: Obstetric history tab updated and reviewed.  Summary of prior pregnancies: 3 pregnancies (reports normal BW and no complications, does not remember if all term, but likely so) 2017: Female, SVD  2020: Female, SVD 2023: Female,  SVD Cesarean delivery: No  Gestational Diabetes:  No Hypertension in pregnancy: No History of preterm birth: No History of  LGA/SGA infant:  No History of shoulder dystocia: No Indications for referral were reviewed, and the patient has no obstetric indications for referral to High Risk OB Clinic at this time.  Prenatal Exam: Gen: Well nourished, well developed.  No distress.  Vitals noted. HEENT: Normocephalic, atraumatic.  Neck supple without cervical lymphadenopathy, thyromegaly or thyroid  nodules.  Fair dentition. CV: RRR no murmur, gallops or rubs Lungs: CTA B.  Normal respiratory effort without wheezes or rales. Abd: soft, NTND. +BS.  Uterus not appreciated above pelvis. GU: Pelvic and bimanual exam deferred. Uterus size appropriate below umbilicus. Ext: No clubbing, cyanosis or edema. Psych: Normal grooming and dress.  Not depressed or anxious appearing.  Normal thought content and process without flight of ideas or looseness of associations  Fetal heart tones: Not yet expected at 10-11 weeks  Assessment/Plan:  Michele Stephens is a 76 y.o. [phone] at Unknown who presents to initiate prenatal care. She is doing well.  Current pregnancy issues include nausea and morning sickness. Denies headache, RUQ pain, dizziness. Occasionally has brief cramps, but rare and self-limited. Takes magnesium oxide for sleeping aide.  Routine prenatal care: As dating is reliable, a dating ultrasound has not been ordered. Dating tab updated. Pre-pregnancy weight updated. Expected weight gain this pregnancy is 25-35 pounds  Indications for referral to HROB were reviewed and the patient does not meet criteria for referral. Medication list reviewed and updated. Magnesium oxide PNV Glutathione OTC supplement Recommended patient see a dentist for regular care. Bleeding and pain precautions reviewed. Importance of prenatal vitamins reviewed. Genetic screening offered. Patient opted for: no screening. The patient has the following indications for aspirinto begin 81  mg at 12-16 weeks: One high risk condition: no single  high risk condition  MORE than one moderate risk condition: low SES  and no other Aspirin was not recommended today based upon above risk factors (one high risk condition or more than one moderate risk factor) The patient will not be age 21 or over at time of delivery. Referral to genetic counseling was not offered today. The patient has the following risk factors for preexisting diabetes:Reviewed indications for early 1 hour glucose testing, not indicated . An early 1 hour glucose tolerance test was not ordered. Pregnancy Medical Home and PHQ-9 forms completed, problems noted: No Patient is Adopt-A-Mom  2. Pregnancy issues include the following which were addressed today:  Discussed AAM resources and provided Michele Stephens' phone number Prenatal labs obtained and will be reviewed next visit ?Hx GDM at HD in 2020, failed 1 hr GTT Patient states with certainty she did not have GDM and was told the diagnosis was a mistake previously Will repeat 1 hr GTT for screening purposes next visit Nausea Intermittent nausea and morning sickness Has been taking unclear glutathione supplement OTC Recommended discontinuing Start doxylamine -pyridoxine    Follow up 4 weeks for next prenatal visit scheduled. "

## 2025-01-17 LAB — CBC/D/PLT+RPR+RH+ABO+RUBIGG...
Antibody Screen: NEGATIVE
Basophils Absolute: 0.1 10*3/uL (ref 0.0–0.2)
Basos: 1 %
Bilirubin, UA: NEGATIVE
EOS (ABSOLUTE): 0.5 10*3/uL — ABNORMAL HIGH (ref 0.0–0.4)
Eos: 6 %
Glucose, UA: NEGATIVE
HCV Ab: NONREACTIVE
HIV Screen 4th Generation wRfx: NONREACTIVE
Hematocrit: 37.8 % (ref 34.0–46.6)
Hemoglobin: 12.8 g/dL (ref 11.1–15.9)
Hepatitis B Surface Ag: NEGATIVE
Immature Grans (Abs): 0 10*3/uL (ref 0.0–0.1)
Immature Granulocytes: 0 %
Ketones, UA: NEGATIVE
Leukocytes,UA: NEGATIVE
Lymphocytes Absolute: 1.5 10*3/uL (ref 0.7–3.1)
Lymphs: 18 %
MCH: 32.2 pg (ref 26.6–33.0)
MCHC: 33.9 g/dL (ref 31.5–35.7)
MCV: 95 fL (ref 79–97)
Monocytes Absolute: 0.4 10*3/uL (ref 0.1–0.9)
Monocytes: 5 %
Neutrophils Absolute: 5.8 10*3/uL (ref 1.4–7.0)
Neutrophils: 70 %
Nitrite, UA: NEGATIVE
Platelets: 260 10*3/uL (ref 150–450)
Protein,UA: NEGATIVE
RBC, UA: NEGATIVE
RBC: 3.97 x10E6/uL (ref 3.77–5.28)
RDW: 12.8 % (ref 11.7–15.4)
RPR Ser Ql: NONREACTIVE
Rh Factor: POSITIVE
Rubella Antibodies, IGG: 1.34 {index}
Specific Gravity, UA: 1.014 (ref 1.005–1.030)
Urobilinogen, Ur: 0.2 mg/dL (ref 0.2–1.0)
WBC: 8.2 10*3/uL (ref 3.4–10.8)
pH, UA: 7 (ref 5.0–7.5)

## 2025-01-17 LAB — HGB FRACTIONATION CASCADE

## 2025-01-17 LAB — HCV INTERPRETATION

## 2025-01-17 LAB — MICROSCOPIC EXAMINATION
Bacteria, UA: NONE SEEN
Casts: NONE SEEN /LPF
RBC, Urine: NONE SEEN /HPF (ref 0–2)
WBC, UA: NONE SEEN /HPF (ref 0–5)

## 2025-02-13 ENCOUNTER — Ambulatory Visit: Payer: Self-pay

## 2025-02-13 ENCOUNTER — Other Ambulatory Visit: Payer: Self-pay

## 2025-02-14 ENCOUNTER — Encounter: Payer: Self-pay | Admitting: Family Medicine
